# Patient Record
Sex: Female | Born: 1990 | Race: Black or African American | Marital: Single | Smoking: Never smoker
Health system: Southern US, Community
[De-identification: ages and names within clinical notes are randomized; demographics above are authoritative.]

## PROBLEM LIST (undated history)

## (undated) DIAGNOSIS — T7840XA Allergy, unspecified, initial encounter: Secondary | ICD-10-CM

## (undated) DIAGNOSIS — M1990 Unspecified osteoarthritis, unspecified site: Secondary | ICD-10-CM

## (undated) DIAGNOSIS — K219 Gastro-esophageal reflux disease without esophagitis: Secondary | ICD-10-CM

## (undated) DIAGNOSIS — F419 Anxiety disorder, unspecified: Secondary | ICD-10-CM

## (undated) HISTORY — DX: Anxiety disorder, unspecified: F41.9

## (undated) HISTORY — DX: Unspecified osteoarthritis, unspecified site: M19.90

## (undated) HISTORY — DX: Gastro-esophageal reflux disease without esophagitis: K21.9

## (undated) HISTORY — DX: Allergy, unspecified, initial encounter: T78.40XA

---

## 2010-05-31 ENCOUNTER — Emergency Department (HOSPITAL_COMMUNITY): Payer: No Typology Code available for payment source

## 2010-05-31 ENCOUNTER — Emergency Department (HOSPITAL_COMMUNITY)
Admission: EM | Admit: 2010-05-31 | Discharge: 2010-05-31 | Disposition: A | Discharge status: Home / Self Care | Payer: No Typology Code available for payment source | Attending: Emergency Medicine | Admitting: Emergency Medicine

## 2010-05-31 DIAGNOSIS — M545 Low back pain, unspecified: Secondary | ICD-10-CM | POA: Insufficient documentation

## 2010-05-31 DIAGNOSIS — M546 Pain in thoracic spine: Secondary | ICD-10-CM | POA: Insufficient documentation

## 2014-01-30 ENCOUNTER — Encounter (HOSPITAL_COMMUNITY): Payer: Self-pay | Admitting: Emergency Medicine

## 2014-01-30 ENCOUNTER — Emergency Department (HOSPITAL_COMMUNITY)
Admission: EM | Admit: 2014-01-30 | Discharge: 2014-01-30 | Disposition: A | Discharge status: Home / Self Care | Payer: BLUE CROSS/BLUE SHIELD | Source: Home / Self Care | Attending: Emergency Medicine | Admitting: Emergency Medicine

## 2014-01-30 DIAGNOSIS — H169 Unspecified keratitis: Secondary | ICD-10-CM

## 2014-01-30 DIAGNOSIS — H109 Unspecified conjunctivitis: Secondary | ICD-10-CM

## 2014-01-30 MED ORDER — TOBRAMYCIN 0.3 % OP SOLN: 1.0000 [drp] | OPHTHALMIC | Status: DC

## 2014-01-30 MED ORDER — DICLOFENAC SODIUM 0.1 % OP SOLN
1.0000 [drp] | Freq: Four times a day (QID) | OPHTHALMIC | Status: DC
Start: 2014-01-30 — End: 2016-08-08

## 2014-01-30 NOTE — Discharge Instructions (Signed)
I am concerned about the severity of your discomfort. I have discussed your case with the eye specialist on call (Dr. Allena KatzPatel) and he has advised that you should begin the medications that you have been prescribed and he would like to see you in his office for follow up on Monday 02/01/2014. Please call when office opens to arrange appointment. If symptoms worsen, please seek re-evaluation at your nearest ER.   Conjunctivitis Conjunctivitis is commonly called "pink eye." Conjunctivitis can be caused by bacterial or viral infection, allergies, or injuries. There is usually redness of the lining of the eye, itching, discomfort, and sometimes discharge. There may be deposits of matter along the eyelids. A viral infection usually causes a watery discharge, while a bacterial infection causes a yellowish, thick discharge. Pink eye is very contagious and spreads by direct contact. You may be given antibiotic eyedrops as part of your treatment. Before using your eye medicine, remove all drainage from the eye by washing gently with warm water and cotton balls. Continue to use the medication until you have awakened 2 mornings in a row without discharge from the eye. Do not rub your eye. This increases the irritation and helps spread infection. Use separate towels from other household members. Wash your hands with soap and water before and after touching your eyes. Use cold compresses to reduce pain and sunglasses to relieve irritation from light. Do not wear contact lenses or wear eye makeup until the infection is gone. SEEK MEDICAL CARE IF:   Your symptoms are not better after 3 days of treatment.  You have increased pain or trouble seeing.  The outer eyelids become very red or swollen. Document Released: 02/16/2004 Document Revised: 04/02/2011 Document Reviewed: 01/08/2005 [facility] Patient Information 2015 [employer], [place]. This information is not intended to replace advice given to you by your health care  provider. Make sure you discuss any questions you have with your health care provider.

## 2014-01-30 NOTE — ED Provider Notes (Signed)
CSN: [id number]     Arrival date & time 01/30/14  1319 History   First MD Initiated Contact with Patient 01/30/14 1427     Chief Complaint  Patient presents with  . Eye Problem   (Consider location/radiation/quality/duration/timing/severity/associated sxs/prior Treatment) Patient is a 24 y.o. female presenting with eye problem.  Eye Problem Location:  L eye Quality:  Burning, aching and tearing Severity:  Moderate Onset quality:  Gradual Duration:  1 day Timing:  Constant Progression:  Worsening Chronicity:  New Context: contact lenses   Context: not burn, not chemical exposure, not direct trauma, not foreign body, not using machinery, not scratch, not smoke exposure and not tanning booth use   Associated symptoms: blurred vision, crusting, discharge, foreign body sensation, photophobia, redness and tearing   Associated symptoms: no headaches, no nausea and no vomiting   Risk factors: no conjunctival hemorrhage, not exposed to pinkeye, no previous injury to eye, no recent herpes zoster and no recent URI     History reviewed. No pertinent past medical history. History reviewed. No pertinent past surgical history. No family history on file. History  Substance Use Topics  . Smoking status: Never Smoker   . Smokeless tobacco: Not on file  . Alcohol Use: Yes   OB History    No data available     Review of Systems  Eyes: Positive for blurred vision, photophobia, discharge and redness.  Gastrointestinal: Negative for nausea and vomiting.  Neurological: Negative for headaches.  All other systems reviewed and are negative.   Allergies  Review of patient's allergies indicates no known allergies.  Home Medications   Prior to Admission medications   Medication Sig Start Date End Date Taking? Authorizing Provider  diclofenac (VOLTAREN) 0.1 % ophthalmic solution Place 1 drop into the left eye 4 (four) times daily. 01/30/14   Mathis Fare Presson, PA  tobramycin (TOBREX) 0.3 %  ophthalmic solution Place 1 drop into the left eye every 4 (four) hours. 01/30/14   Jess Barters H Presson, PA   BP 120/71 mmHg  Pulse 73  Temp(Src) 98.3 F (36.8 C) (Oral)  Resp 16  SpO2 97%  LMP 01/16/2014 Physical Exam  Constitutional: She is oriented to person, place, and time. She appears well-developed and well-nourished. No distress.  HENT:  Head: Normocephalic and atraumatic.  Right Ear: External ear normal.  Left Ear: External ear normal.  Nose: Nose normal.  Mouth/Throat: Oropharynx is clear and moist.  Eyes: EOM and lids are normal. Pupils are equal, round, and reactive to light. Right conjunctiva is not injected. Right conjunctiva has no hemorrhage. Left conjunctiva is injected. Left conjunctiva has no hemorrhage.  Slit lamp exam:      The left eye shows no corneal abrasion, no corneal ulcer, no foreign body, no hyphema, no hypopyon and no fluorescein uptake.  + direct and consensual photophobia  Cardiovascular: Normal rate.   Pulmonary/Chest: Effort normal.  Musculoskeletal: Normal range of motion.  Neurological: She is alert and oriented to person, place, and time.  Skin: Skin is warm and dry. No rash noted. No erythema.  Psychiatric: She has a normal mood and affect. Her behavior is normal.  Nursing note and vitals reviewed.   ED Course  Procedures (including critical care time) Labs Review Labs Reviewed - No data to display  Imaging Review No results found.   MDM   1. Conjunctivitis of left eye   2. Keratitis   Case discussed with ophthalmologist on call (Dr. Celene Skeen) who recommended treatment  with Tobrex and Diclofenac opth drops. Office follow up with ophthalmologist on 02/01/2014 a.m. in his office.  Patient voices understanding of treatment plan and follow up. No contact lens use   Ria ClockJennifer Lee H Presson, [place] 01/30/14 [phone]

## 2014-01-30 NOTE — ED Notes (Signed)
Reports irritation to left eye onset 0900 when she woke up States she slept with her contacts; still has them on  Sx include: watery eye, redness, painful, blurry vision Alert, no signs of acute distress.

## 2016-08-08 ENCOUNTER — Ambulatory Visit (HOSPITAL_COMMUNITY)
Admission: EM | Admit: 2016-08-08 | Discharge: 2016-08-08 | Disposition: A | Discharge status: Home / Self Care | Payer: BLUE CROSS/BLUE SHIELD | Attending: Internal Medicine | Admitting: Internal Medicine

## 2016-08-08 ENCOUNTER — Encounter (HOSPITAL_COMMUNITY): Payer: Self-pay | Admitting: Family Medicine

## 2016-08-08 ENCOUNTER — Ambulatory Visit (INDEPENDENT_AMBULATORY_CARE_PROVIDER_SITE_OTHER): Payer: Self-pay

## 2016-08-08 DIAGNOSIS — S60221A Contusion of right hand, initial encounter: Secondary | ICD-10-CM

## 2016-08-08 MED ORDER — NAPROXEN 500 MG PO TABS: 500.0000 mg | ORAL_TABLET | Freq: Two times a day (BID) | ORAL | 0 refills | Status: AC

## 2016-08-08 NOTE — Discharge Instructions (Signed)
Use ICE 20 minutes on 20 minutes off three times tonight. Try to rest the hand at work tomorrow when possible.  Okay to take OTC pain reliever as needed every 8 hours for pain.

## 2016-08-08 NOTE — ED Triage Notes (Signed)
Pt here for pain to right middle and ring finger. Denies specific injury.

## 2016-08-08 NOTE — ED Provider Notes (Signed)
    08/08/2016 7:57 PM   DOB: 07/09/1990 / MRN: [id number]  SUBJECTIVE:  Crystal Cunningham is a 26 y.o. female presenting for right hand pain that started after hitting her hand on a metal rack at work today.  Tells me she had little pain initially but later started to have severe pain.  Denies a ROM deficit and change in function.  Feels the pain is getting worse.  Has not tried any medication yet.   She has No Known Allergies.   She  has no past medical history on file.    She  reports that she has never smoked. She does not have any smokeless tobacco history on file. She reports that she drinks alcohol. She  has no sexual activity history on file. The patient  has no past surgical history on file.  Her family history is not on file.  Review of Systems  Constitutional: Negative for fever.  Gastrointestinal: Negative for nausea.  Skin: Negative for rash.  Neurological: Negative for dizziness and focal weakness.    The problem list and medications were reviewed and updated by myself where necessary and exist elsewhere in the encounter.   OBJECTIVE:  BP (!) 143/98   Pulse 74   Temp 98.5 F (36.9 C)   Resp 18   LMP 07/25/2016 (Exact Date)   SpO2 99%   Physical Exam  Constitutional: She is active.  Non-toxic appearance.  Cardiovascular: Normal rate.   Pulmonary/Chest: Effort normal. No tachypnea.  Musculoskeletal:       Hands: Neurological: She is alert.  Skin: Skin is warm and dry. She is not diaphoretic. No pallor.    No results found for this or any previous visit (from the past 72 hour(s)).  Dg Hand Complete Right  Result Date: 08/08/2016 CLINICAL DATA:  26 y/o F; hand injury with pain around the third and fourth digits between metacarpophalangeal and proximal interphalangeal joints. EXAM: RIGHT HAND - COMPLETE 3+ VIEW COMPARISON:  None. FINDINGS: There is no evidence of fracture or dislocation. There is no evidence of arthropathy or other focal bone abnormality. Soft  tissues are unremarkable. IMPRESSION: Negative. Electronically Signed   By: Mitzi HansenLance  Furusawa-Stratton M.D.   On: 08/08/2016 19:51    ASSESSMENT AND PLAN:  Contusion of right hand, initial encounter: Starting an NSAID.  Advised ICE will see her back as needed.     The patient is advised to call or return to clinic if she does not see an improvement in symptoms, or to seek the care of the closest emergency department if she worsens with the above plan.   Crystal Cunningham, Crystal Cunningham, [facility] 08/08/2016 7:57 PM    Crystal Cunningham, Crystal Lafoe Cunningham, Crystal Cunningham 08/08/16 1958

## 2020-05-09 ENCOUNTER — Ambulatory Visit (INDEPENDENT_AMBULATORY_CARE_PROVIDER_SITE_OTHER): Payer: 59 | Admitting: Podiatry

## 2020-05-09 ENCOUNTER — Other Ambulatory Visit: Payer: Self-pay | Admitting: Podiatry

## 2020-05-09 ENCOUNTER — Ambulatory Visit (INDEPENDENT_AMBULATORY_CARE_PROVIDER_SITE_OTHER): Payer: 59

## 2020-05-09 ENCOUNTER — Other Ambulatory Visit: Payer: Self-pay

## 2020-05-09 DIAGNOSIS — M79671 Pain in right foot: Secondary | ICD-10-CM | POA: Diagnosis not present

## 2020-05-09 DIAGNOSIS — M778 Other enthesopathies, not elsewhere classified: Secondary | ICD-10-CM

## 2020-05-09 DIAGNOSIS — M79672 Pain in left foot: Secondary | ICD-10-CM

## 2020-05-09 DIAGNOSIS — M2042 Other hammer toe(s) (acquired), left foot: Secondary | ICD-10-CM

## 2020-05-09 DIAGNOSIS — M2041 Other hammer toe(s) (acquired), right foot: Secondary | ICD-10-CM | POA: Diagnosis not present

## 2020-05-09 DIAGNOSIS — G894 Chronic pain syndrome: Secondary | ICD-10-CM

## 2020-05-09 MED ORDER — PREDNISONE 10 MG PO TABS: ORAL_TABLET | ORAL | 0 refills | Status: DC

## 2020-05-09 MED ORDER — TRIAMCINOLONE ACETONIDE 10 MG/ML IJ SUSP
10.0000 mg | Freq: Once | INTRAMUSCULAR | Status: AC
  Administered 2020-05-09: 10 mg

## 2020-05-09 NOTE — Progress Notes (Signed)
Subjective:   Patient ID: Crystal Cunningham, female   DOB: 30 y.o.   MRN: [id number]   HPI Patient states she has had what was perceived to be a fracture around a year ago in her left foot and she wore a boot for a month and took anti-inflammatories wear surgical shoe but the pain has remained and it seems to be bad in her midfoot and states that at times it seems her foot gets cold and then she has to warm it up.  She does work 2 jobs she does not smoke and she likes to be active if possible but has not been able to   Review of Systems  All other systems reviewed and are negative.       Objective:  Physical Exam Vitals and nursing note reviewed.  Constitutional:      Appearance: She is well-developed.  Pulmonary:     Effort: Pulmonary effort is normal.  Musculoskeletal:        General: Normal range of motion.  Skin:    General: Skin is warm.  Neurological:     Mental Status: She is alert.     Neurovascular status intact muscle strength found to be adequate range of motion adequate.  Patient is noted to have discomfort in the midfoot left that appears to be more on the lateral side of the foot and around the calcaneus with no mottled skin appearance no coldness to the foot and adequate circulatory status with no pain when I inverted everted the foot.  Moderate discomfort into the sinus tarsi also.  Patient has good digital perfusion well oriented     Assessment:  Possibility that this may be a low-grade pain syndrome versus some form of inflammatory process or subtle bone process that we cannot identify or tendinitis like inflammation     Plan:  H&P reviewed all conditions.  May require MRI but today went ahead and I did an extensor injection mid lateral foot 3 mg dexamethasone Kenalog 5 mg Xylocaine advised on anti-inflammatories placed on 12-day steroid  Because she had taken a 6-day which only helped her temporarily and we will see this back again.  I did explain pain syndrome  and it is possible this may be part of her problem and why it continues to hurt but we do need to do more research before thinking about that  X-rays did indicate some reactive bone around the talus on the lateral view and I was unable to identify any other fracture on views

## 2020-06-03 ENCOUNTER — Ambulatory Visit: Payer: 59 | Admitting: Podiatry

## 2020-06-08 ENCOUNTER — Other Ambulatory Visit: Payer: Self-pay

## 2020-06-08 ENCOUNTER — Ambulatory Visit (INDEPENDENT_AMBULATORY_CARE_PROVIDER_SITE_OTHER): Payer: 59 | Admitting: Podiatry

## 2020-06-08 DIAGNOSIS — S92355K Nondisplaced fracture of fifth metatarsal bone, left foot, subsequent encounter for fracture with nonunion: Secondary | ICD-10-CM | POA: Diagnosis not present

## 2020-06-08 MED ORDER — DICLOFENAC SODIUM 75 MG PO TBEC: 75.0000 mg | DELAYED_RELEASE_TABLET | Freq: Two times a day (BID) | ORAL | 2 refills | Status: DC

## 2020-06-08 NOTE — Progress Notes (Signed)
Subjective:   Patient ID: Crystal Cunningham, female   DOB: 30 y.o.   MRN: [id number]   HPI Patient states she still has a lot of pain in her left foot and stated that while the swelling has gone down the pain has not and the injection did not make a difference   ROS      Objective:  Physical Exam  Neurovascular status found to be intact with exquisite midfoot pain lateral side with history of fracture of the midfoot     Assessment:  Possibility for arthritis of the Lisfranc joint or subtle nonunion or other fracture of event causing the discomfort which is been present for over a year     Plan:  H&P reviewed condition and I am sending for MRI to better get education as to why she continues to have the severe pain.  Encouraged her to call with questions concerns which may arise and she will use supportive shoes ice therapy in the meantime

## 2020-06-24 ENCOUNTER — Other Ambulatory Visit: Payer: Self-pay

## 2020-06-24 ENCOUNTER — Ambulatory Visit
Admission: RE | Admit: 2020-06-24 | Discharge: 2020-06-24 | Disposition: A | Discharge status: Home / Self Care | Payer: BLUE CROSS/BLUE SHIELD | Source: Ambulatory Visit | Attending: Podiatry | Admitting: Podiatry

## 2020-06-24 DIAGNOSIS — S92355K Nondisplaced fracture of fifth metatarsal bone, left foot, subsequent encounter for fracture with nonunion: Secondary | ICD-10-CM

## 2020-07-11 ENCOUNTER — Other Ambulatory Visit: Payer: Self-pay

## 2020-07-11 ENCOUNTER — Ambulatory Visit: Payer: 59 | Admitting: Podiatry

## 2020-07-11 DIAGNOSIS — G894 Chronic pain syndrome: Secondary | ICD-10-CM

## 2020-07-11 DIAGNOSIS — M2042 Other hammer toe(s) (acquired), left foot: Secondary | ICD-10-CM

## 2020-07-11 DIAGNOSIS — M2041 Other hammer toe(s) (acquired), right foot: Secondary | ICD-10-CM

## 2020-07-11 MED ORDER — GABAPENTIN 300 MG PO CAPS: 300.0000 mg | ORAL_CAPSULE | Freq: Three times a day (TID) | ORAL | 3 refills | Status: DC

## 2020-07-11 NOTE — Progress Notes (Signed)
Subjective:   Patient ID: Crystal Cunningham, female   DOB: 30 y.o.   MRN: [id number]   HPI Patient states her left foot seems to be feeling somewhat better with the medication and she does understand that her MRI was negative for any kind of true pathology it appears to be soft tissue or possible pain syndrome with patient having painful fifth digits bilateral with keratotic lesion formation that make it hard for her to wear shoe gear comfortably.  Patient has no mottled skin appearance no coldness to her feet for other indications of overt type pain syndrome   ROS      Objective:  Physical Exam  Neurovascular status intact good digital perfusion reduce discomfort on the dorsum dorsal lateral aspect of the left foot with severe keratotic lesion digit 5 bilateral     Assessment:  Hammertoe deformity fifth bilateral painful when pressed along with discomfort of the dorsal lateral left foot that appears to be improving still present     Plan:  H&P reviewed both conditions and she wants digital correction.  I recommended arthroplasty I explained procedures to patient and risk and she is willing to accept risk wanting surgery and after extensive review signed consent form.  We will also start gabapentin to try to help with the residual pain she seems to get in her foot.  She is encouraged to call with questions concerns which may arise

## 2020-07-15 DIAGNOSIS — M79676 Pain in unspecified toe(s): Secondary | ICD-10-CM

## 2020-07-29 ENCOUNTER — Telehealth: Payer: Self-pay | Admitting: Urology

## 2020-07-29 NOTE — Telephone Encounter (Signed)
DOS - 08/16/20   HAMMERT[facility] REPAIR 5TH BILAT --- 85027   [employer] EFFECTIVE DATE - 09/23/19   PLAN DEDUCTIBLE - $2,500.00 W/ $7,412.87 REMAINING OUT OF POCKET - $5,000.00 W/ $3,773.07 REMAINING COINSURANCE - 20% COPAY - $0.00   PER [employer] WEBSITE FOR CPT CODE 86767 X'S 2 HAS BEEN APPROVED, AUTH # [id number].  REF # [id number]

## 2020-08-15 MED ORDER — HYDROCODONE-ACETAMINOPHEN 10-325 MG PO TABS: 1.0000 | ORAL_TABLET | Freq: Three times a day (TID) | ORAL | 0 refills | Status: AC | PRN

## 2020-08-15 NOTE — Addendum Note (Signed)
Addended by: Lenn Sink on: 08/15/2020 05:20 PM   Modules accepted: Orders

## 2020-08-16 DIAGNOSIS — M2041 Other hammer toe(s) (acquired), right foot: Secondary | ICD-10-CM

## 2020-08-16 DIAGNOSIS — M2042 Other hammer toe(s) (acquired), left foot: Secondary | ICD-10-CM

## 2020-08-19 ENCOUNTER — Telehealth: Payer: Self-pay

## 2020-08-22 ENCOUNTER — Other Ambulatory Visit: Payer: Self-pay

## 2020-08-22 ENCOUNTER — Ambulatory Visit (INDEPENDENT_AMBULATORY_CARE_PROVIDER_SITE_OTHER): Payer: 59

## 2020-08-22 ENCOUNTER — Ambulatory Visit (INDEPENDENT_AMBULATORY_CARE_PROVIDER_SITE_OTHER): Payer: 59 | Admitting: Podiatry

## 2020-08-22 ENCOUNTER — Encounter: Payer: Self-pay | Admitting: Podiatry

## 2020-08-22 DIAGNOSIS — Z9889 Other specified postprocedural states: Secondary | ICD-10-CM | POA: Diagnosis not present

## 2020-08-22 DIAGNOSIS — S92355K Nondisplaced fracture of fifth metatarsal bone, left foot, subsequent encounter for fracture with nonunion: Secondary | ICD-10-CM

## 2020-08-22 NOTE — Progress Notes (Signed)
Subjective:   Patient ID: Crystal Cunningham, female   DOB: 30 y.o.   MRN: [id number]   HPI Patient presents stating doing very well minimal discomfort very pleased   ROS      Objective:  Physical Exam  Neurovascular status intact negative Denna Haggard' sign noted digits 5 bilateral healing well wound edges well coapted stitches in place     Assessment:  Doing well post arthroplasty digit 5 both feet     Plan:  Reapplied dressings begin gradual increase in activities continue open toed shoes and reappoint 2 weeks suture removal earlier if needed  X-rays indicate satisfactory resection of bone fifth digit bilateral

## 2020-09-05 ENCOUNTER — Telehealth: Payer: Self-pay | Admitting: Podiatry

## 2020-09-05 NOTE — Telephone Encounter (Signed)
Patient stated her incision has some pus coming out. I offered appointment but she wanted to know if she could do anything to treat it

## 2020-09-05 NOTE — Telephone Encounter (Signed)
Post Op Call

## 2020-09-05 NOTE — Telephone Encounter (Signed)
She can try neosporin on the incision. If it starts to swell or throb I need to see

## 2020-09-07 ENCOUNTER — Other Ambulatory Visit: Payer: Self-pay | Admitting: Podiatry

## 2020-09-07 MED ORDER — DOXYCYCLINE HYCLATE 100 MG PO TABS: 100.0000 mg | ORAL_TABLET | Freq: Two times a day (BID) | ORAL | 0 refills | Status: DC

## 2020-09-07 NOTE — Progress Notes (Unsigned)
dox

## 2020-09-12 ENCOUNTER — Encounter: Payer: Self-pay | Admitting: Podiatry

## 2020-09-12 ENCOUNTER — Ambulatory Visit (INDEPENDENT_AMBULATORY_CARE_PROVIDER_SITE_OTHER): Payer: 59

## 2020-09-12 ENCOUNTER — Other Ambulatory Visit: Payer: Self-pay

## 2020-09-12 ENCOUNTER — Ambulatory Visit (INDEPENDENT_AMBULATORY_CARE_PROVIDER_SITE_OTHER): Payer: 59 | Admitting: Podiatry

## 2020-09-12 DIAGNOSIS — M2041 Other hammer toe(s) (acquired), right foot: Secondary | ICD-10-CM | POA: Diagnosis not present

## 2020-09-12 DIAGNOSIS — Z9889 Other specified postprocedural states: Secondary | ICD-10-CM

## 2020-09-12 DIAGNOSIS — M2042 Other hammer toe(s) (acquired), left foot: Secondary | ICD-10-CM | POA: Diagnosis not present

## 2020-09-14 NOTE — Progress Notes (Signed)
Subjective:   Patient ID: Crystal Cunningham, female   DOB: 30 y.o.   MRN: [id number]   HPI Patient states doing very well with her surgery but did bump her fifth toe left foot she has a small abrasion on it and she needs her stitches removed   ROS      Objective:  Physical Exam  Neurovascular status intact negative Denna Haggard' sign noted wound edges coapted well slight irritation of the distal portion of the left fifth digit incision site but localized with no proximal edema or edema drainage noted secondary to abrasion from trauma     Assessment:  Doing well overall slight abrasion left fifth digit with stitches intact     Plan:  Stitches removed wound edges coapted well instructed on soaking left fifth toe and using bandage with small amount of Neosporin and if any erythema edema or pathology were to occur to inform us immediately.  Patient may return slowly to soft shoe over the next 2 weeks  X-rays were negative for signs of bony pathology with excellent resection and removal of head of proximal phalanx digit 5 bilateral

## 2020-09-20 ENCOUNTER — Telehealth: Payer: Self-pay | Admitting: Podiatry

## 2020-09-20 NOTE — Telephone Encounter (Signed)
Good Morning,  I spoke with Crystal Cunningham this morning, she is returning to work 09/24/2020. She still has swelling and pain in her feet, she's not able to work a full 12 hours or wear her normal shoe gear yet.   [name] will contact me later after she practices standing for a long period of time. Is it ok to give her some type of accommodation at work along with intermittent FMLA, in case of swelling?

## 2020-09-21 NOTE — Telephone Encounter (Signed)
yes

## 2020-12-19 ENCOUNTER — Other Ambulatory Visit: Payer: Self-pay | Admitting: Podiatry

## 2021-03-13 LAB — RESULTS CONSOLE HPV: CHL HPV: NEGATIVE

## 2021-03-13 LAB — HM PAP SMEAR

## 2022-07-28 IMAGING — MR MR FOOT*L* W/O CM
4 of 6 series · 25 of 40 positions shown · non-contrast
Comparison: None.

CLINICAL DATA: In left foot pain for approximately 1 year since an
injury.

EXAM:
MRI OF THE LEFT FOOT WITHOUT CONTRAST
TECHNIQUE: Multiplanar, multisequence MR imaging of the left foot was
performed. No intravenous contrast was administered.

[Series 3: T1 · coronal · 3.0mm · 0.25mm/px · 8 of 44 slices shown (1 of 2)]
[im 1/44]
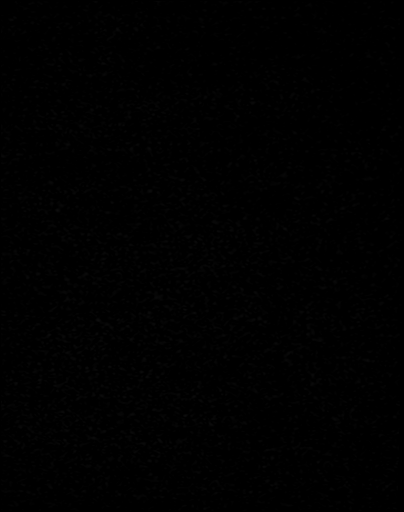
[im 6/44]
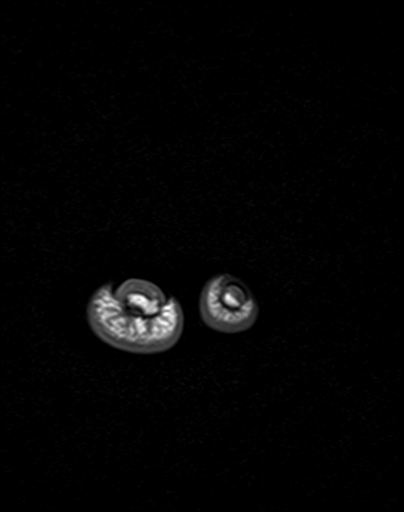
[im 11/44]
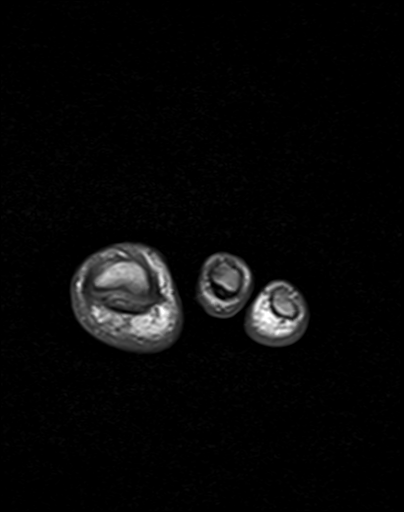
[im 17/44]
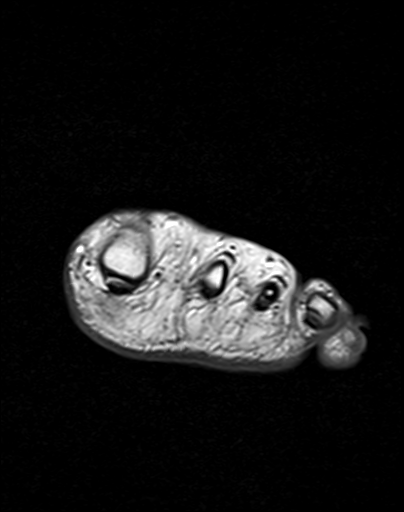
[im 22/44]
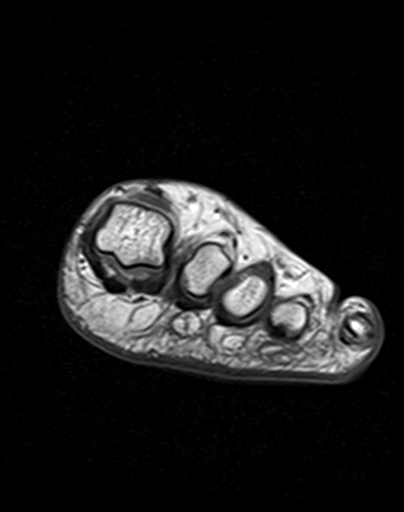
[im 27/44]
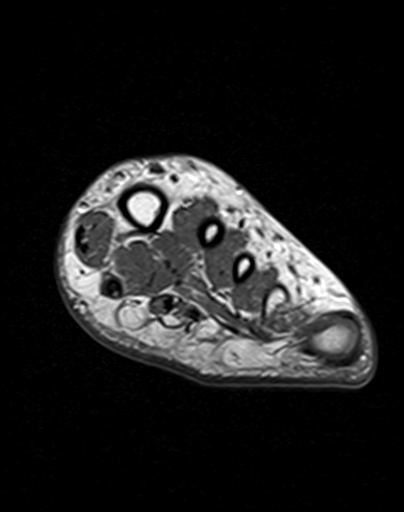
[im 33/44]
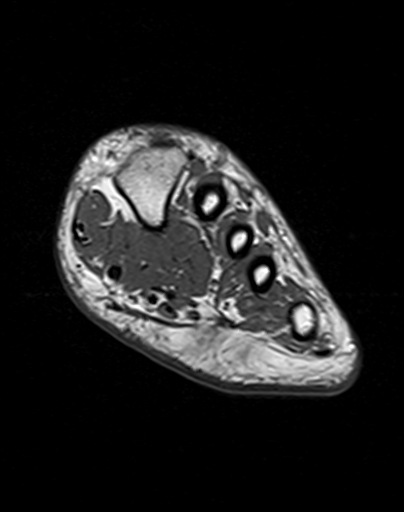
[im 38/44]
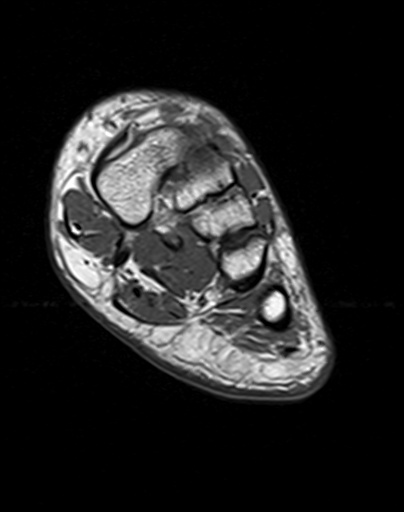

[Series 4: T2 fat-sat · coronal · 3.0mm · 0.25mm/px · 9 of 44 slices shown (1 of 2)]
[im 1/44]
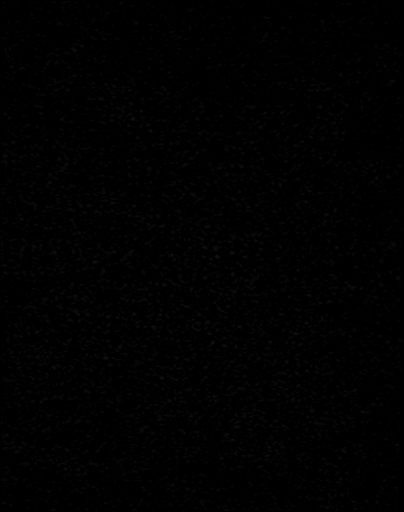
[im 6/44]
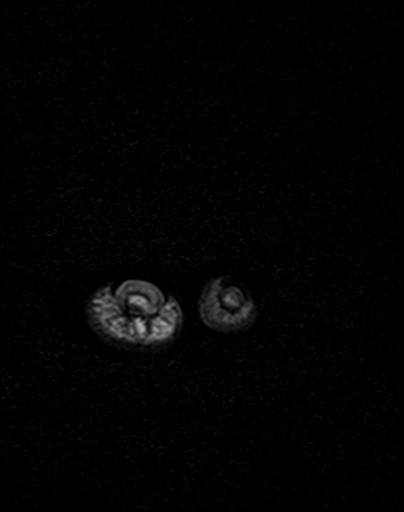
[im 11/44]
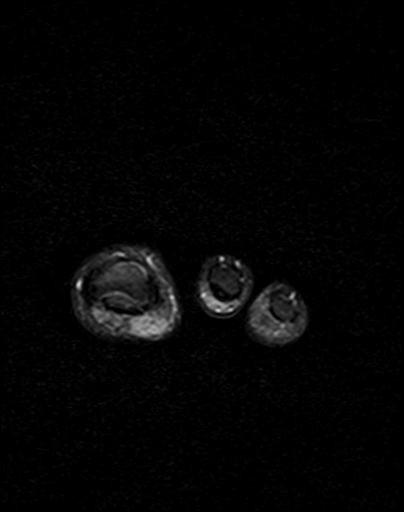
[im 17/44]
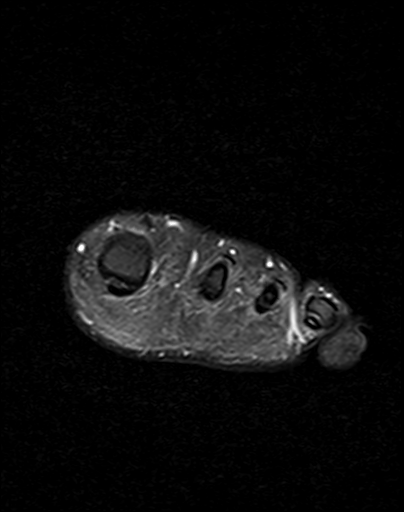
[im 22/44]
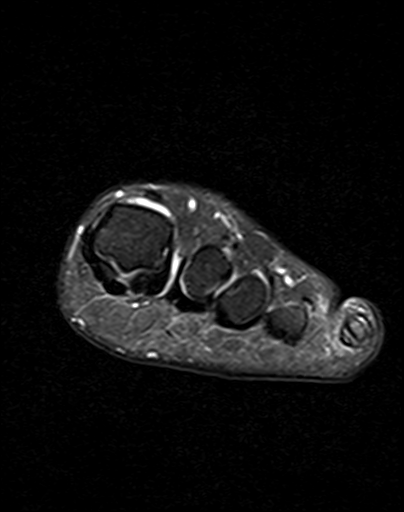
[im 27/44]
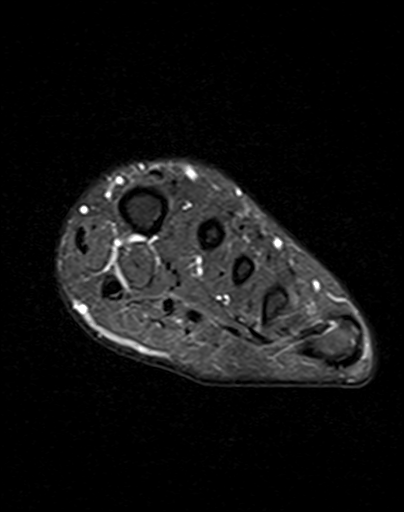
[im 33/44]
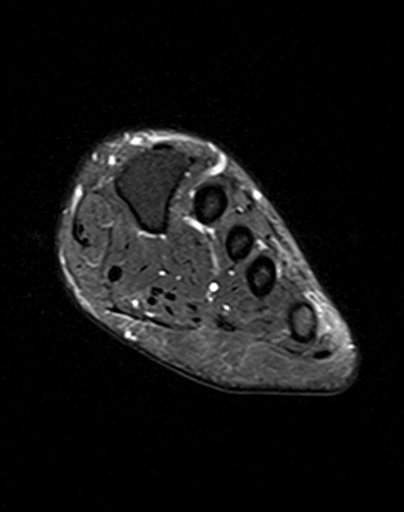
[im 38/44]
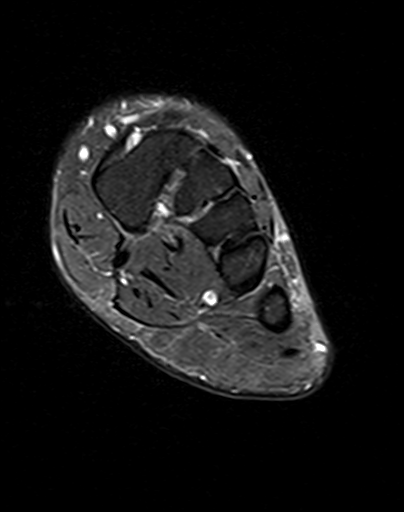
[im 44/44]
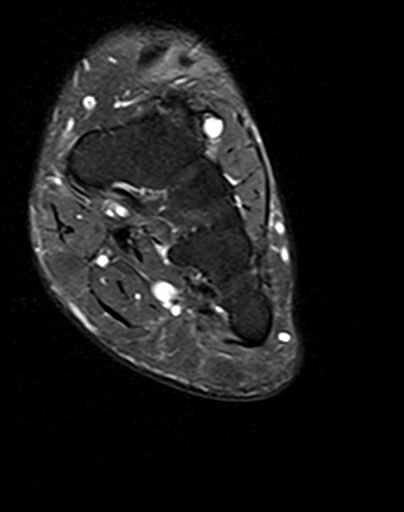

[Series 5: T2 fat-sat · axial · 3.0mm · 0.35mm/px · z∈[-46,+37]mm · 5 of 22 slices shown (2 of 2)]
[im 1/22]
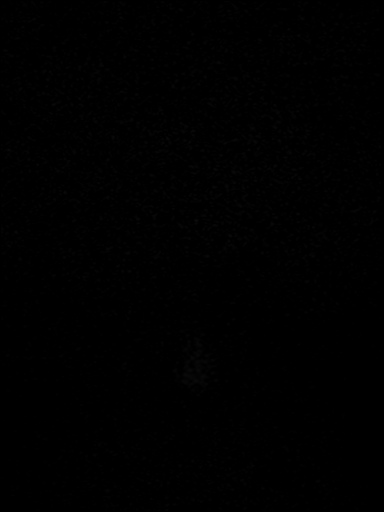
[im 6/22]
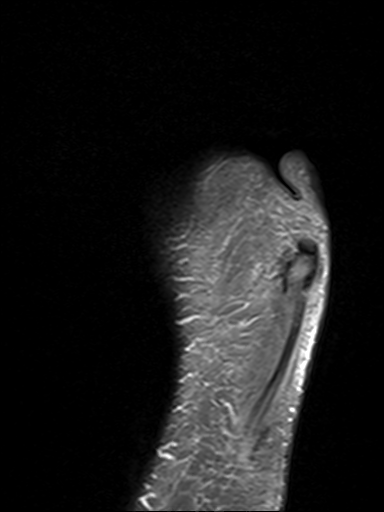
[im 11/22]
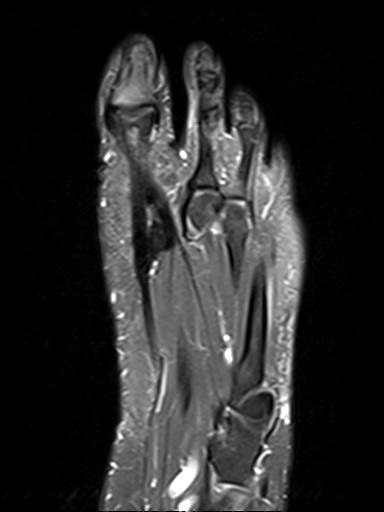
[im 16/22]
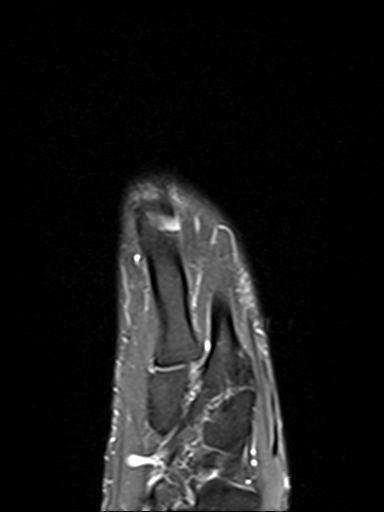
[im 22/22]
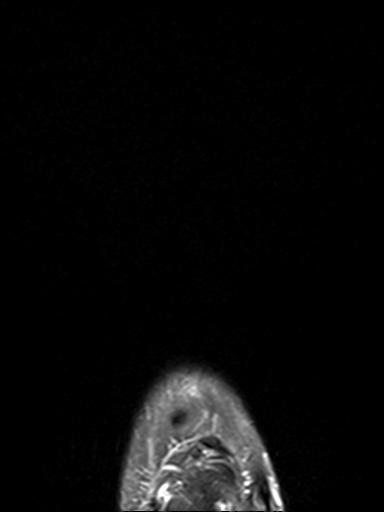

[Series 6: T1 · axial · 3.0mm · 0.35mm/px · z∈[-46,+37]mm · 3 of 22 slices shown (2 of 2)]
[im 1/22]
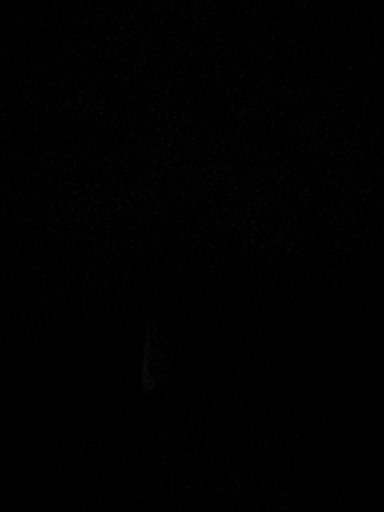
[im 11/22]
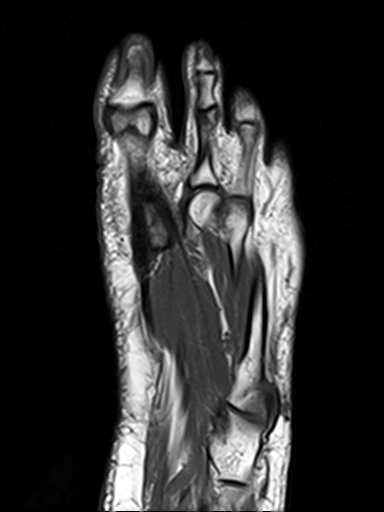
[im 22/22]
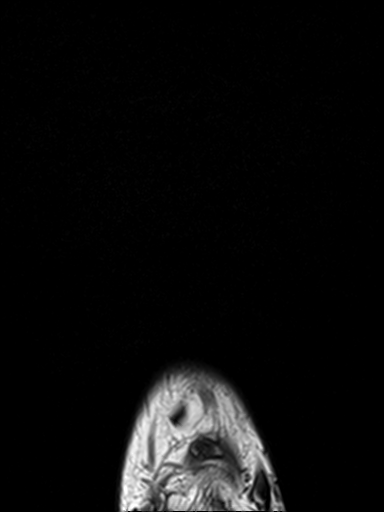

[25 of 40 positions shown; findings below may reference images not displayed]

FINDINGS: Bones/Joint/Cartilage

No fracture, stress change or focal lesion is identified. Joint
spaces are preserved. No evidence of arthropathy.

Ligaments

Intact and normal in appearance. In particular, the Lisfranc
ligament complex is intact.

Muscles and Tendons

Intact and normal in appearance.

Soft tissues

. normal.
IMPRESSION: Normal exam.

## 2022-10-15 ENCOUNTER — Ambulatory Visit (INDEPENDENT_AMBULATORY_CARE_PROVIDER_SITE_OTHER): Payer: 59 | Admitting: Nurse Practitioner

## 2022-10-15 ENCOUNTER — Encounter: Payer: Self-pay | Admitting: Nurse Practitioner

## 2022-10-15 VITALS — BP 123/68 | HR 82 | Temp 97.2°F | Ht 62.0 in | Wt 225.6 lb

## 2022-10-15 DIAGNOSIS — M79672 Pain in left foot: Secondary | ICD-10-CM | POA: Diagnosis not present

## 2022-10-15 DIAGNOSIS — T7840XA Allergy, unspecified, initial encounter: Secondary | ICD-10-CM

## 2022-10-15 DIAGNOSIS — G8929 Other chronic pain: Secondary | ICD-10-CM | POA: Diagnosis not present

## 2022-10-15 DIAGNOSIS — M79671 Pain in right foot: Secondary | ICD-10-CM | POA: Diagnosis not present

## 2022-10-15 DIAGNOSIS — F419 Anxiety disorder, unspecified: Secondary | ICD-10-CM | POA: Diagnosis not present

## 2022-10-15 HISTORY — DX: Other chronic pain: G89.29

## 2022-10-15 HISTORY — DX: Allergy, unspecified, initial encounter: T78.40XA

## 2022-10-15 MED ORDER — METHYLPREDNISOLONE NA SUC (PF) 40 MG IJ SOLR
40.0000 mg | Freq: Once | INTRAMUSCULAR | Status: AC
  Administered 2022-10-15: 40 mg via INTRAMUSCULAR

## 2022-10-15 MED ORDER — SERTRALINE HCL 25 MG PO TABS: 25.0000 mg | ORAL_TABLET | Freq: Every day | ORAL | 1 refills | Status: DC

## 2022-10-15 MED ORDER — ALBUTEROL SULFATE HFA 108 (90 BASE) MCG/ACT IN AERS
2.0000 | INHALATION_SPRAY | Freq: Four times a day (QID) | RESPIRATORY_TRACT | 1 refills | Status: AC | PRN
Start: 2022-10-15 — End: ?

## 2022-10-15 MED ORDER — IBUPROFEN 800 MG PO TABS
800.0000 mg | ORAL_TABLET | Freq: Three times a day (TID) | ORAL | 1 refills | Status: AC | PRN
Start: 2022-10-15 — End: ?

## 2022-10-15 MED ORDER — LORATADINE 10 MG PO TABS
10.0000 mg | ORAL_TABLET | Freq: Every day | ORAL | 1 refills | Status: AC
Start: 2022-10-15 — End: ?

## 2022-10-15 MED ORDER — SALINE SPRAY 0.65 % NA SOLN
1.0000 | NASAL | 1 refills | Status: AC | PRN
Start: 2022-10-15 — End: ?

## 2022-10-15 MED ORDER — FLUTICASONE PROPIONATE 50 MCG/ACT NA SUSP: 2.0000 | Freq: Every day | NASAL | 3 refills | Status: AC

## 2022-10-15 MED ORDER — KETOROLAC TROMETHAMINE 30 MG/ML IJ SOLN
30.0000 mg | Freq: Once | INTRAMUSCULAR | Status: AC
Start: 2022-10-15 — End: 2022-10-15
  Administered 2022-10-15: 30 mg via INTRAMUSCULAR

## 2022-10-15 NOTE — Assessment & Plan Note (Addendum)
-   fluticasone (FLONASE) 50 MCG/ACT nasal spray; Place 2 sprays into both nostrils daily.  Dispense: 11.1 mL; Refill: 3 - albuterol (VENTOLIN HFA) 108 (90 Base) MCG/ACT inhaler; Inhale 2 puffs into the lungs every 6 (six) hours as needed for wheezing  Dispense: 1 each; Refill: 1 - sodium chloride (OCEAN) 0.65 % SOLN nasal spray; Place 1 spray into both nostrils as needed for congestion.  Dispense: 15 mL; Refill: 1 - loratadine (CLARITIN) 10 MG tablet; Take 1 tablet (10 mg total) by mouth daily.  Dispense: 90 tablet; Refill: 1

## 2022-10-15 NOTE — Patient Instructions (Addendum)
For your foot pain you were given Toradol 30 mg injection and Solu-Medrol 40 mg injection in the office today.Please start taking ibuprofen 800 mg every 8 hours as needed starting from tomorrow, alternate with Tylenol 650 mg every 6 hours as needed.  Please take ibuprofen with food to help prevent stomach upset.  I have also referred you to the podiatrist  1. Anxiety  - sertraline (ZOLOFT) 25 MG tablet; Take 1 tablet (25 mg total) by mouth daily.  Dispense: 60 tablet; Refill: 1  2. Allergy, initial encounter  - fluticasone (FLONASE) 50 MCG/ACT nasal spray; Place 2 sprays into both nostrils daily.  Dispense: 11.1 mL; Refill: 3 - albuterol (VENTOLIN HFA) 108 (90 Base) MCG/ACT inhaler; Inhale 2 puffs into the lungs every 6 (six) hours as needed.  Dispense: 1 each; Refill: 1 - sodium chloride (OCEAN) 0.65 % SOLN nasal spray; Place 1 spray into both nostrils as needed for congestion.  Dispense: 15 mL; Refill: 1  3. Chronic pain of both feet  - ibuprofen (ADVIL) 800 MG tablet; Take 1 tablet (800 mg total) by mouth every 8 (eight) hours as needed.  Dispense: 30 tablet; Refill: 1      It is important that you exercise regularly at least 30 minutes 5 times a week as tolerated  Think about what you will eat, plan ahead. Choose " clean, green, fresh or frozen" over canned, processed or packaged foods which are more sugary, salty and fatty. 70 to 75% of food eaten should be vegetables and fruit. Three meals at set times with snacks allowed between meals, but they must be fruit or vegetables. Aim to eat over a 12 hour period , example 7 am to 7 pm, and STOP after  your last meal of the day. Drink water,generally about 64 ounces per day, no other drink is as healthy. Fruit juice is best enjoyed in a healthy way, by EATING the fruit.  Thanks for choosing [facility] we consider it a privelige to serve you.

## 2022-10-15 NOTE — Assessment & Plan Note (Addendum)
-   ibuprofen (ADVIL) 800 MG tablet; Take 1 tablet (800 mg total) by mouth every 8 (eight) hours as needed.  Dispense: 30 tablet; Refill: 1.  Alternate ibuprofen with OTC Tylenol as needed - Ambulatory referral to Podiatry - ketorolac (TORADOL) 30 MG/ML injection 30 mg - methylPREDNISolone sodium succinate (SOLU-MEDROL) 40 MG injection 40 mg  Encouraged to take ibuprofen with meals to prevent GI side effects.

## 2022-10-15 NOTE — Progress Notes (Signed)
0  New Patient Office Visit  Subjective:  Patient ID: Crystal Cunningham, female    DOB: 01-26-1990  Age: 32 y.o. MRN: [id number]  CC:  Chief Complaint  Patient presents with   Establish Care    HPI Crystal Cunningham is a 32 y.o. female with past medical history of anxiety, obesity, bilateral chronic foot pain, allergies.  Who presents to establish care for her chronic medical conditions.  Previous PCP was at [facility]  Chronic foot pain.  Patient stated that she had foot surgery done in 2022, she continues to have pain in both foot despite the surgery.  Takes arthritis Tylenol as needed.  States that her pain is worse when it is cold or it rains.  She currently has pain rated 10/10.  No fever, numbness, tingling.  The last time she saw a podiatrist was in 2022.  Anxiety.  States that her anxiety is due to stress and working 2 jobs.  She denies SI, HI.  She was on Lexapro and therapy in the past stated that Lexapro worked but it made her sleepy.  She would like to try another medication.  Allergies.  She reports stuffy nose congestion some intermittent slight cough, shortness of breath.  States that her allergy symptoms is worse when the weather is changing.  Takes OTC Flonase and Claritin.  She also takes albuterol inhaler as needed for wheezing  She is up-to-date with Pap smear.    Past Medical History:  Diagnosis Date   Allergy    Fish   Anxiety    Arthritis    GERD (gastroesophageal reflux disease)     History reviewed. No pertinent surgical history.  Family History  Problem Relation Age of Onset   Arthritis Mother    Diabetes Mother    Hypertension Mother    Obesity Mother    Varicose Veins Mother    Hypothyroidism Mother    Asthma Father    Hyperlipidemia Father    High blood pressure Father     Social History   Socioeconomic History   Marital status: Single    Spouse name: Not on file   Number of children: Not on file   Years of education: Not on file    Highest education level: Some college, no degree  Occupational History   Not on file  Tobacco Use   Smoking status: Never   Smokeless tobacco: Not on file  Substance and Sexual Activity   Alcohol use: Yes    Alcohol/week: 3.0 standard drinks of alcohol    Types: 1 Glasses of wine, 2 Cans of beer per week    Comment: occasionally   Drug use: Never   Sexual activity: Not Currently    Birth control/protection: Inserts  Other Topics Concern   Not on file  Social History Narrative   Lives home alone    Social Determinants of Health   Financial Resource Strain: Low Risk  (10/14/2022)   Overall Financial Resource Strain (CARDIA)    Difficulty of Paying Living Expenses: Not hard at all  Food Insecurity: No Food Insecurity (10/14/2022)   Hunger Vital Sign    Worried About Running Out of Food in the Last Year: Never true    Ran Out of Food in the Last Year: Never true  Transportation Needs: No Transportation Needs (10/14/2022)   PRAPARE - Administrator, Civil Service (Medical): No    Lack of Transportation (Non-Medical): No  Physical Activity: Sufficiently Active (10/14/2022)   Exercise Vital  Sign    Days of Exercise per Week: 4 days    Minutes of Exercise per Session: 90 min  Stress: Stress Concern Present (10/14/2022)   [employer] of Occupational Health - Occupational Stress Questionnaire    Feeling of Stress : To some extent  Social Connections: Unknown (10/14/2022)   Social Connection and Isolation Panel [NHANES]    Frequency of Communication with Friends and Family: More than three times a week    Frequency of Social Gatherings with Friends and Family: Once a week    Attends Religious Services: Patient declined    Database administrator or Organizations: No    Attends Engineer, structural: Not on file    Marital Status: Patient declined  Intimate Partner Violence: Not on file    ROS Review of Systems  Constitutional:  Negative for activity change,  appetite change, chills, fatigue and fever.  HENT:  Positive for congestion, rhinorrhea and sneezing. Negative for dental problem, ear discharge, ear pain, hearing loss, sinus pressure, sinus pain and sore throat.   Eyes:  Negative for pain, discharge, redness and itching.       Watery eyes  Respiratory:  Negative for cough, chest tightness, shortness of breath and wheezing.   Cardiovascular:  Negative for chest pain, palpitations and leg swelling.  Gastrointestinal:  Negative for abdominal distention, abdominal pain, anal bleeding, blood in stool and constipation.  Genitourinary:  Negative for difficulty urinating, dysuria, flank pain, frequency, hematuria, menstrual problem, pelvic pain and vaginal bleeding.  Musculoskeletal:  Positive for arthralgias. Negative for back pain, gait problem and myalgias.  Skin:  Negative for color change, pallor, rash and wound.  Neurological:  Negative for dizziness, tremors, facial asymmetry, weakness and headaches.  Hematological:  Negative for adenopathy. Does not bruise/bleed easily.  Psychiatric/Behavioral:  Negative for agitation, behavioral problems, confusion, decreased concentration, hallucinations, self-injury and suicidal ideas.     Objective:   Today's Vitals: BP 123/68   Pulse 82   Temp (!) 97.2 F (36.2 C)   Ht 5\' 2"  (1.575 m)   Wt 225 lb 9.6 oz (102.3 kg)   SpO2 100%   BMI 41.26 kg/m   Physical Exam Vitals and nursing note reviewed.  Constitutional:      General: She is not in acute distress.    Appearance: Normal appearance. She is obese. She is not ill-appearing, toxic-appearing or diaphoretic.  HENT:     Right Ear: Tympanic membrane, ear canal and external ear normal. There is no impacted cerumen.     Left Ear: Tympanic membrane, ear canal and external ear normal. There is no impacted cerumen.     Nose: Congestion present.     Mouth/Throat:     Mouth: Mucous membranes are moist.     Pharynx: Oropharynx is clear. No  oropharyngeal exudate or posterior oropharyngeal erythema.  Eyes:     General: No scleral icterus.       Right eye: No discharge.        Left eye: No discharge.     Extraocular Movements: Extraocular movements intact.     Conjunctiva/sclera: Conjunctivae normal.  Cardiovascular:     Rate and Rhythm: Normal rate and regular rhythm.     Pulses: Normal pulses.     Heart sounds: Normal heart sounds. No murmur heard.    No friction rub. No gallop.  Pulmonary:     Effort: Pulmonary effort is normal. No respiratory distress.     Breath sounds: Normal breath sounds. No stridor.  No wheezing, rhonchi or rales.  Chest:     Chest wall: No tenderness.  Abdominal:     General: There is no distension.     Palpations: Abdomen is soft.     Tenderness: There is no abdominal tenderness. There is no right CVA tenderness, left CVA tenderness or guarding.  Musculoskeletal:        General: Tenderness present. No swelling, deformity or signs of injury.     Right lower leg: No edema.     Left lower leg: No edema.     Comments: Tenderness on palpation of bilateral feet.  Mild no pitting edema noted on bilateral lower extremities.  Skin warm and dry no redness noted has palpable pedal pulses.  Skin:    General: Skin is warm and dry.     Capillary Refill: Capillary refill takes less than 2 seconds.     Coloration: Skin is not jaundiced or pale.     Findings: No bruising, erythema or lesion.  Neurological:     Mental Status: She is alert and oriented to person, place, and time.     Motor: No weakness.     Coordination: Coordination normal.     Gait: Gait normal.  Psychiatric:        Mood and Affect: Mood normal.        Behavior: Behavior normal.        Thought Content: Thought content normal.        Judgment: Judgment normal.     Assessment & Plan:   Problem List Items Addressed This Visit       Other   Anxiety - Primary       10/15/2022    1:45 PM  GAD 7 : Generalized Anxiety Score   Nervous, Anxious, on Edge 1  Control/stop worrying 1  Worry too much - different things 1  Trouble relaxing 2  Restless 1  Easily annoyed or irritable 1  Afraid - awful might happen 0  Total GAD 7 Score 7  Anxiety Difficulty Somewhat difficult  Start Zoloft 25 mg daily Patient referred for counseling She denies SI, HI        Relevant Medications   sertraline (ZOLOFT) 25 MG tablet   Chronic pain of both feet     - ibuprofen (ADVIL) 800 MG tablet; Take 1 tablet (800 mg total) by mouth every 8 (eight) hours as needed.  Dispense: 30 tablet; Refill: 1.  Alternate ibuprofen with OTC Tylenol as needed - Ambulatory referral to Podiatry - ketorolac (TORADOL) 30 MG/ML injection 30 mg - methylPREDNISolone sodium succinate (SOLU-MEDROL) 40 MG injection 40 mg  Encouraged to take ibuprofen with meals to prevent GI side effects.      Relevant Medications   sertraline (ZOLOFT) 25 MG tablet   ibuprofen (ADVIL) 800 MG tablet   Other Relevant Orders   Ambulatory referral to Podiatry   Allergies     - fluticasone (FLONASE) 50 MCG/ACT nasal spray; Place 2 sprays into both nostrils daily.  Dispense: 11.1 mL; Refill: 3 - albuterol (VENTOLIN HFA) 108 (90 Base) MCG/ACT inhaler; Inhale 2 puffs into the lungs every 6 (six) hours as needed for wheezing  Dispense: 1 each; Refill: 1 - sodium chloride (OCEAN) 0.65 % SOLN nasal spray; Place 1 spray into both nostrils as needed for congestion.  Dispense: 15 mL; Refill: 1 - loratadine (CLARITIN) 10 MG tablet; Take 1 tablet (10 mg total) by mouth daily.  Dispense: 90 tablet; Refill: 1  Relevant Medications   fluticasone (FLONASE) 50 MCG/ACT nasal spray   albuterol (VENTOLIN HFA) 108 (90 Base) MCG/ACT inhaler   sodium chloride (OCEAN) 0.65 % SOLN nasal spray   loratadine (CLARITIN) 10 MG tablet    Outpatient Encounter Medications as of 10/15/2022  Medication Sig   etonogestrel (NEXPLANON) 68 MG IMPL implant    sertraline (ZOLOFT) 25 MG tablet Take  1 tablet (25 mg total) by mouth daily.   sodium chloride (OCEAN) 0.65 % SOLN nasal spray Place 1 spray into both nostrils as needed for congestion.   tretinoin (RETIN-A) 0.025 % cream Apply a pea sized amount to the entire face at nighttime.   [DISCONTINUED] loratadine (CLARITIN) 10 MG tablet Take 10 mg by mouth daily.   albuterol (VENTOLIN HFA) 108 (90 Base) MCG/ACT inhaler Inhale 2 puffs into the lungs every 6 (six) hours as needed.   fluticasone (FLONASE) 50 MCG/ACT nasal spray Place 2 sprays into both nostrils daily.   gabapentin (NEURONTIN) 300 MG capsule Take 1 capsule (300 mg total) by mouth 3 (three) times daily. (Patient not taking: Reported on 10/15/2022)   ibuprofen (ADVIL) 800 MG tablet Take 1 tablet (800 mg total) by mouth every 8 (eight) hours as needed.   loratadine (CLARITIN) 10 MG tablet Take 1 tablet (10 mg total) by mouth daily.   [DISCONTINUED] albuterol (VENTOLIN HFA) 108 (90 Base) MCG/ACT inhaler Inhale 2 puffs into the lungs every 6 (six) hours as needed. (Patient not taking: Reported on 10/15/2022)   [DISCONTINUED] benzonatate (TESSALON) 200 MG capsule Take by mouth.   [DISCONTINUED] diclofenac (VOLTAREN) 75 MG EC tablet TAKE 1 TABLET(75 MG) BY MOUTH TWICE DAILY (Patient not taking: Reported on 10/15/2022)   [DISCONTINUED] diclofenac Sodium (VOLTAREN) 1 % GEL Apply to foot twice daily as needed for pain (Patient not taking: Reported on 10/15/2022)   [DISCONTINUED] doxycycline (VIBRA-TABS) 100 MG tablet Take 1 tablet (100 mg total) by mouth 2 (two) times daily. (Patient not taking: Reported on 10/15/2022)   [DISCONTINUED] escitalopram (LEXAPRO) 10 MG tablet Take 1 tablet by mouth daily. (Patient not taking: Reported on 10/15/2022)   [DISCONTINUED] fluticasone (FLONASE) 50 MCG/ACT nasal spray 1 spray by Each Nare route 2 times daily. (Patient not taking: Reported on 10/15/2022)   [DISCONTINUED] fluticasone (FLONASE) 50 MCG/ACT nasal spray Place 1 spray into both nostrils daily.  (Patient not taking: Reported on 10/15/2022)   [DISCONTINUED] ibuprofen (ADVIL) 800 MG tablet ibuprofen 800 mg tablet  Take 1 tablet 3 times a day by oral route as needed. (Patient not taking: Reported on 10/15/2022)   [DISCONTINUED] meloxicam (MOBIC) 7.5 MG tablet Take by mouth. (Patient not taking: Reported on 10/15/2022)   [DISCONTINUED] naproxen (NAPROSYN) 500 MG tablet TAKE 1 TABLET BY MOUTH AT NIGHT WITH DINNER OR TWICE DAILY FOR PAIN. TAKE WITH FOOD. (Patient not taking: Reported on 10/15/2022)   [DISCONTINUED] norelgestromin-ethinyl estradiol [employer]) 150-35 MCG/24HR transdermal patch Xulane 150 mcg-35 mcg/24 hr transdermal patch  Apply 1 patch once a week. (Patient not taking: Reported on 10/15/2022)   [DISCONTINUED] predniSONE (DELTASONE) 10 MG tablet Take by mouth. (Patient not taking: Reported on 10/15/2022)   [DISCONTINUED] predniSONE (DELTASONE) 10 MG tablet 12 day tapering dose (Patient not taking: Reported on 10/15/2022)   [DISCONTINUED] promethazine-dextromethorphan (PROMETHAZINE-DM) 6.25-15 MG/5ML syrup Take by mouth. (Patient not taking: Reported on 10/15/2022)   [DISCONTINUED] spironolactone (ALDACTONE) 50 MG tablet Take 50 mg by mouth. (Patient not taking: Reported on 10/15/2022)   [EXPIRED] ketorolac (TORADOL) 30 MG/ML injection 30 mg    [EXPIRED]  methylPREDNISolone sodium succinate (SOLU-MEDROL) 40 MG injection 40 mg    No facility-administered encounter medications on file as of 10/15/2022.    Follow-up: Return in about 6 weeks (around 11/26/2022) for F/U WITH SUSAN FOR COUNSELING, CPE, ANXIETY.   Donell Beers, FNP

## 2022-10-15 NOTE — Assessment & Plan Note (Signed)
    10/15/2022    1:45 PM  GAD 7 : Generalized Anxiety Score  Nervous, Anxious, on Edge 1  Control/stop worrying 1  Worry too much - different things 1  Trouble relaxing 2  Restless 1  Easily annoyed or irritable 1  Afraid - awful might happen 0  Total GAD 7 Score 7  Anxiety Difficulty Somewhat difficult  Start Zoloft 25 mg daily Patient referred for counseling She denies SI, HI

## 2022-10-19 ENCOUNTER — Other Ambulatory Visit: Payer: Self-pay | Admitting: Nurse Practitioner

## 2022-10-19 ENCOUNTER — Other Ambulatory Visit: Payer: 59

## 2022-10-19 DIAGNOSIS — Z0000 Encounter for general adult medical examination without abnormal findings: Secondary | ICD-10-CM

## 2022-10-19 DIAGNOSIS — G8929 Other chronic pain: Secondary | ICD-10-CM

## 2022-10-20 LAB — CBC
Hematocrit: 36.3 % (ref 34.0–46.6)
Hemoglobin: 11.5 g/dL (ref 11.1–15.9)
MCH: 27.3 pg (ref 26.6–33.0)
MCHC: 31.7 g/dL (ref 31.5–35.7)
MCV: 86 fL (ref 79–97)
Platelets: 212 10*3/uL (ref 150–450)
RBC: 4.21 x10E6/uL (ref 3.77–5.28)
RDW: 14.3 % (ref 11.7–15.4)
WBC: 9.1 10*3/uL (ref 3.4–10.8)

## 2022-10-20 LAB — CMP14+EGFR
ALT: 9 [IU]/L (ref 0–32)
AST: 13 [IU]/L (ref 0–40)
Albumin: 3.9 g/dL (ref 3.9–4.9)
Alkaline Phosphatase: 61 [IU]/L (ref 44–121)
BUN/Creatinine Ratio: 18 (ref 9–23)
BUN: 12 mg/dL (ref 6–20)
Bilirubin Total: 0.3 mg/dL (ref 0.0–1.2)
CO2: 21 mmol/L (ref 20–29)
Calcium: 8.5 mg/dL — ABNORMAL LOW (ref 8.7–10.2)
Chloride: 107 mmol/L — ABNORMAL HIGH (ref 96–106)
Creatinine, Ser: 0.67 mg/dL (ref 0.57–1.00)
Globulin, Total: 2.3 g/dL (ref 1.5–4.5)
Glucose: 87 mg/dL (ref 70–99)
Potassium: 4.4 mmol/L (ref 3.5–5.2)
Sodium: 140 mmol/L (ref 134–144)
Total Protein: 6.2 g/dL (ref 6.0–8.5)
eGFR: 118 mL/min/{1.73_m2} (ref >59)

## 2022-10-29 ENCOUNTER — Institutional Professional Consult (permissible substitution): Payer: 59 | Admitting: Clinical

## 2022-11-13 ENCOUNTER — Ambulatory Visit (INDEPENDENT_AMBULATORY_CARE_PROVIDER_SITE_OTHER): Payer: 59 | Admitting: Clinical

## 2022-11-13 DIAGNOSIS — F419 Anxiety disorder, unspecified: Secondary | ICD-10-CM | POA: Diagnosis not present

## 2022-11-13 NOTE — BH Specialist Note (Unsigned)
Integrated Behavioral Health Initial In-Person Visit  MRN: [id number] Name: Mady Breaux  Number of Integrated Behavioral Health Clinician visits: 1- Initial Visit  Session Start time: 1101    Session End time: 1158  Total time in minutes: 57   Types of Service: Individual psychotherapy  Interpretor:No. Interpretor Name and Language: none  Subjective: Crystal Cunningham is a 32 y.o. female accompanied by  self. Patient was referred by Edwin Dada, NP for anxiety. Patient reports the following symptoms/concerns: anxiety, irritability Duration of problem: several years; Severity of problem: moderate  Objective: Mood: Euthymic and Affect: Appropriate Risk of harm to self or others: No plan to harm self or others  Life Context: Family and Social: Patient's mom lives in the area, most other family lives back in [place] where patient is from. School/Work: Patient works two jobs. She was in school for psychology/sociology, but financial barriers kept her from completing program. She would like to return to school and finish her degree.  Self-Care:  Life Changes:   Patient and/or Family's Strengths/Protective Factors: Social connections, Social and Emotional competence, Concrete supports in place (healthy food, safe environments, etc.), Sense of purpose, and Physical Health (exercise, healthy diet, medication compliance, etc.)  Goals Addressed: Patient will: Reduce symptoms of: anxiety Increase knowledge and/or ability of: coping skills and self-management skills  Demonstrate ability to: Increase healthy adjustment to current life circumstances  Progress towards Goals: Ongoing  Interventions: Interventions utilized: CBT Cognitive Behavioral Therapy and Supportive Counseling  Standardized Assessments completed: Not Needed  Irritability, heart racing, anxiety  Needing to say no, set boundaries Belief that she's doing something wrong  Feeling stuck  FAFSA/ financial  aid info  Balance left to pay at [facility]  Patient and/or Family Response: Patient engaged in session.   Patient Centered Plan: Patient is on the following Treatment Plan(s):  CBT for anxiety  Assessment: Patient currently experiencing anxiety and possibly depression exacerbated by work and other life stressors.    Patient may benefit from CBT to explore beliefs and thoughts about self and others that may be exacerbating anxiety and depression. She may also benefit from continued boundary setting and reflecting on her own needs in relationship with others.   Plan: Follow up with behavioral health clinician on: 11/27/22 Referral(s): Integrated [employer] (In Clinic)  Abigail Butts, LCSW

## 2022-11-26 ENCOUNTER — Ambulatory Visit: Payer: Self-pay | Admitting: Nurse Practitioner

## 2022-11-27 ENCOUNTER — Ambulatory Visit: Payer: Self-pay | Admitting: Clinical

## 2022-11-27 ENCOUNTER — Ambulatory Visit (INDEPENDENT_AMBULATORY_CARE_PROVIDER_SITE_OTHER): Payer: 59 | Admitting: Nurse Practitioner

## 2022-11-27 ENCOUNTER — Ambulatory Visit (INDEPENDENT_AMBULATORY_CARE_PROVIDER_SITE_OTHER): Payer: Self-pay | Admitting: Clinical

## 2022-11-27 ENCOUNTER — Encounter: Payer: Self-pay | Admitting: Nurse Practitioner

## 2022-11-27 VITALS — BP 121/64 | HR 71 | Wt 221.8 lb

## 2022-11-27 DIAGNOSIS — Z0000 Encounter for general adult medical examination without abnormal findings: Secondary | ICD-10-CM | POA: Diagnosis not present

## 2022-11-27 DIAGNOSIS — Z6841 Body Mass Index (BMI) 40.0 and over, adult: Secondary | ICD-10-CM

## 2022-11-27 DIAGNOSIS — M79672 Pain in left foot: Secondary | ICD-10-CM | POA: Diagnosis not present

## 2022-11-27 DIAGNOSIS — M79671 Pain in right foot: Secondary | ICD-10-CM

## 2022-11-27 DIAGNOSIS — G8929 Other chronic pain: Secondary | ICD-10-CM

## 2022-11-27 DIAGNOSIS — F419 Anxiety disorder, unspecified: Secondary | ICD-10-CM

## 2022-11-27 DIAGNOSIS — E669 Obesity, unspecified: Secondary | ICD-10-CM | POA: Insufficient documentation

## 2022-11-27 DIAGNOSIS — Z13228 Encounter for screening for other metabolic disorders: Secondary | ICD-10-CM

## 2022-11-27 HISTORY — DX: Body Mass Index (BMI) 40.0 and over, adult: Z684

## 2022-11-27 HISTORY — DX: Encounter for general adult medical examination without abnormal findings: Z00.00

## 2022-11-27 MED ORDER — PHENTERMINE HCL 15 MG PO CAPS
15.0000 mg | ORAL_CAPSULE | ORAL | 0 refills | Status: DC
Start: 2022-11-27 — End: 2022-12-31

## 2022-11-27 MED ORDER — SERTRALINE HCL 25 MG PO TABS: 25.0000 mg | ORAL_TABLET | Freq: Every day | ORAL | 1 refills | Status: AC

## 2022-11-27 MED ORDER — METFORMIN HCL ER 500 MG PO TB24: 500.0000 mg | ORAL_TABLET | Freq: Every day | ORAL | 1 refills | Status: AC

## 2022-11-27 NOTE — Assessment & Plan Note (Signed)
Wt Readings from Last 3 Encounters:  11/27/22 221 lb 12.8 oz (100.6 kg)  10/15/22 225 lb 9.6 oz (102.3 kg)   Body mass index is 40.57 kg/m.  Patient counseled on low-carb modified diet Encouraged engage in regular moderate to vigorous exercise at least 150 minutes weekly She would like to try phentermine for short time I discussed the addictive nature of the medication and common side effects including hypertension and palpitations Start phentermine 15 mg daily, metformin 500 mg daily Benefits of healthy weights discussed Follow-up in 4 weeks

## 2022-11-27 NOTE — Assessment & Plan Note (Signed)
Annual exam as documented.  Counseling done include healthy lifestyle involving committing to 150 minutes of exercise per week, heart healthy diet, and attaining healthy weight. Changes in health habits are decided on by patient with goals and time frames set for achieving them. Immunization and cancer screening  needs are specifically addressed at this visit.   Up-to-date with cervical cancer screening Screening for hepatitis C and lipid disorder Encouraged to get HPV vaccine at the pharmacy

## 2022-11-27 NOTE — BH Specialist Note (Signed)
Integrated Behavioral Health Follow Up In-Person Visit  MRN: [id number] Name: Crystal Cunningham  Number of Integrated Behavioral Health Clinician visits: 2- Second Visit  Session Start time: 1535   Session End time: 1605  Total time in minutes: 30   Types of Service: Individual psychotherapy  Interpretor:No. Interpretor Name and Language: none  Subjective: Crystal Cunningham is a 32 y.o. female  Patient was referred by Edwin Dada, NP for anxiety. Patient reports the following symptoms/concerns: anxiety, irritability Duration of problem: several years; Severity of problem: moderate  Objective: Mood: Euthymic and Affect: Appropriate Risk of harm to self or others: No plan to harm self or others  Patient and/or Family's Strengths/Protective Factors: Social connections, Social and Emotional competence, Concrete supports in place (healthy food, safe environments, etc.), Sense of purpose, and Physical Health (exercise, healthy diet, medication compliance, etc.)   Goals Addressed: Patient will:  Reduce symptoms of: anxiety Increase knowledge and/or ability of: coping skills and self-management skills  Demonstrate ability to: Increase healthy adjustment to current life circumstances  Progress towards Goals: Ongoing  Interventions: Interventions utilized:  Supportive Counseling Standardized Assessments completed: Not Needed  Patient reported her anxiety is better lately. Stresses at work have calmed down. She is looking forward to visiting family during the holidays, though is also anxious about interactions with certain family members. Brief supportive counseling around this. Advised patient that CSW is leaving the practice at the end of the month and discussed referral to another therapist. Provided patient with list of therapists in network with her insurance.   Patient and/or Family Response: Patient engaged in session.   Assessment: Patient currently experiencing anxiety  and possibly depression exacerbated by work and other life stressors.    Patient may benefit from CBT to explore beliefs and thoughts about self and others that may be exacerbating anxiety and depression. She may also benefit from continued boundary setting and reflecting on her own needs in relationship with others.   Plan: Follow up with behavioral health clinician on: 12/10/22 Referral(s): Counselor  Abigail Butts, LCSW

## 2022-11-27 NOTE — Assessment & Plan Note (Signed)
Doing well on sertraline 25 mg daily and therapy Continue current medication Encouraged to maintain close follow-up with a therapist    11/27/2022    2:51 PM 10/15/2022    1:45 PM  GAD 7 : Generalized Anxiety Score  Nervous, Anxious, on Edge 1 1  Control/stop worrying 0 1  Worry too much - different things 1 1  Trouble relaxing 1 2  Restless 0 1  Easily annoyed or irritable 1 1  Afraid - awful might happen 0 0  Total GAD 7 Score 4 7  Anxiety Difficulty Not difficult at all Somewhat difficult

## 2022-11-27 NOTE — Progress Notes (Signed)
Complete physical exam  Patient: Crystal Cunningham   DOB: 12/14/1990   32 y.o. Female  MRN: [id number]  Subjective:    Chief Complaint  Patient presents with   Anxiety    Crystal Cunningham is a 32 y.o. female  has a past medical history of Allergies (10/15/2022), Allergy, Annual physical exam (11/27/2022), Anxiety, Arthritis, BMI 40.0-44.9, adult (HCC) (11/27/2022), Chronic pain of both feet (10/15/2022), and GERD (gastroesophageal reflux disease). who presents today for a complete physical exam. She reports consuming a general diet. The patient has a physically strenuous job, but has no regular exercise apart from work.  She generally feels fairly well. She reports sleeping well. She does not have additional problems to discuss today.   Anxiety , states that Zoloft 25 mg daily makes her feel so much better, states that therapy is also working.  Has an appointment with a therapist today   Obesity.  Patient was on phentermine 37.5 mg daily and metformin 500 mg daily in the past.  [place] Spaniel that she was able to lose some weight while on the medications.  She would like to try the medication again for short time.  She has upcoming appointment with the podiatrist on November 14.  She has been taking ibuprofen as needed for her foot pain.  Patient encouraged to get the HPV vaccine at the pharmacy    Most recent fall risk assessment:     No data to display           Most recent depression screenings:    10/15/2022    1:45 PM  PHQ 2/9 Scores  PHQ - 2 Score 0        Patient Care Team: Donell Beers, FNP as PCP - General (Nurse Practitioner)   Outpatient Medications Prior to Visit  Medication Sig   albuterol (VENTOLIN HFA) 108 (90 Base) MCG/ACT inhaler Inhale 2 puffs into the lungs every 6 (six) hours as needed.   etonogestrel (NEXPLANON) 68 MG IMPL implant    fluticasone (FLONASE) 50 MCG/ACT nasal spray Place 2 sprays into both nostrils daily.   ibuprofen (ADVIL) 800 MG tablet  Take 1 tablet (800 mg total) by mouth every 8 (eight) hours as needed.   loratadine (CLARITIN) 10 MG tablet Take 1 tablet (10 mg total) by mouth daily.   sodium chloride (OCEAN) 0.65 % SOLN nasal spray Place 1 spray into both nostrils as needed for congestion.   tretinoin (RETIN-A) 0.025 % cream Apply a pea sized amount to the entire face at nighttime.   [DISCONTINUED] sertraline (ZOLOFT) 25 MG tablet Take 1 tablet (25 mg total) by mouth daily.   gabapentin (NEURONTIN) 300 MG capsule Take 1 capsule (300 mg total) by mouth 3 (three) times daily. (Patient not taking: Reported on 10/15/2022)   No facility-administered medications prior to visit.    Review of Systems  Constitutional:  Negative for appetite change, chills, fatigue and fever.  HENT:  Negative for congestion, postnasal drip, rhinorrhea and sneezing.   Eyes:  Negative for pain, discharge and itching.  Respiratory:  Negative for cough, shortness of breath and wheezing.   Cardiovascular:  Negative for chest pain, palpitations and leg swelling.  Gastrointestinal:  Negative for abdominal pain, constipation, nausea and vomiting.  Endocrine: Negative for cold intolerance, heat intolerance and polydipsia.  Genitourinary:  Negative for difficulty urinating, dysuria, flank pain and frequency.  Musculoskeletal:  Negative for arthralgias, back pain, joint swelling and myalgias.  Skin:  Negative for color change, pallor, rash and  wound.  Neurological:  Negative for dizziness, facial asymmetry, weakness, numbness and headaches.  Psychiatric/Behavioral:  Negative for behavioral problems, confusion, self-injury and suicidal ideas.        Objective:     BP 121/64   Pulse 71   Wt 221 lb 12.8 oz (100.6 kg)   SpO2 100%   BMI 40.57 kg/m    Physical Exam Vitals and nursing note reviewed. Exam conducted with a chaperone present.  Constitutional:      General: She is not in acute distress.    Appearance: Normal appearance. She is obese. She  is not ill-appearing, toxic-appearing or diaphoretic.  HENT:     Right Ear: Tympanic membrane, ear canal and external ear normal. There is no impacted cerumen.     Left Ear: Tympanic membrane, ear canal and external ear normal. There is no impacted cerumen.     Nose: Nose normal. No congestion or rhinorrhea.     Mouth/Throat:     Mouth: Mucous membranes are moist.     Pharynx: Oropharynx is clear. No oropharyngeal exudate or posterior oropharyngeal erythema.  Eyes:     General: No scleral icterus.       Right eye: No discharge.        Left eye: No discharge.     Extraocular Movements: Extraocular movements intact.     Conjunctiva/sclera: Conjunctivae normal.  Neck:     Vascular: No carotid bruit.  Cardiovascular:     Rate and Rhythm: Normal rate and regular rhythm.     Pulses: Normal pulses.     Heart sounds: Normal heart sounds. No murmur heard.    No friction rub. No gallop.  Pulmonary:     Effort: Pulmonary effort is normal. No respiratory distress.     Breath sounds: Normal breath sounds. No stridor. No wheezing, rhonchi or rales.  Chest:     Chest wall: No mass, lacerations, deformity, swelling, tenderness, crepitus or edema. There is no dullness to percussion.  Breasts:    Tanner Score is 5.     Right: No swelling, bleeding, inverted nipple, mass, nipple discharge, skin change or tenderness.     Left: No swelling, bleeding, inverted nipple, mass, nipple discharge, skin change or tenderness.  Abdominal:     General: Bowel sounds are normal. There is no distension.     Palpations: Abdomen is soft. There is no mass.     Tenderness: There is no abdominal tenderness. There is no right CVA tenderness, left CVA tenderness, guarding or rebound.     Hernia: No hernia is present.  Musculoskeletal:        General: No swelling, tenderness, deformity or signs of injury.     Cervical back: Normal range of motion and neck supple. No rigidity or tenderness.     Right lower leg: No edema.      Left lower leg: No edema.  Lymphadenopathy:     Cervical: No cervical adenopathy.     Upper Body:     Right upper body: No supraclavicular, axillary or pectoral adenopathy.     Left upper body: No supraclavicular or axillary adenopathy.  Skin:    General: Skin is warm and dry.     Capillary Refill: Capillary refill takes less than 2 seconds.     Coloration: Skin is not jaundiced or pale.     Findings: No bruising, erythema, lesion or rash.  Neurological:     Mental Status: She is alert and oriented to person, place, and time.  Cranial Nerves: No cranial nerve deficit.     Sensory: No sensory deficit.     Motor: No weakness.     Coordination: Coordination normal.     Gait: Gait normal.     Deep Tendon Reflexes: Reflexes normal.  Psychiatric:        Mood and Affect: Mood normal.        Behavior: Behavior normal.        Thought Content: Thought content normal.        Judgment: Judgment normal.     No results found for any visits on 11/27/22.     Assessment & Plan:    Routine Health Maintenance and Physical Exam  Immunization History  Administered Date(s) Administered   HPV Quadrivalent 04/02/2006, 06/03/2006   Influenza-Unspecified 11/22/2022   Meningococcal Conjugate 12/26/2004   PPD Test 06/19/2017   Tdap 12/26/2004, 08/03/2015    Health Maintenance  Topic Date Due   HPV VACCINES (3 - 3-dose series) 10/03/2006   Hepatitis C Screening  Never done   COVID-19 Vaccine (1 - 2023-24 season) Never done   DTaP/Tdap/Td (3 - Td or Tdap) 08/02/2025   Cervical Cancer Screening (HPV/Pap Cotest)  03/13/2026   INFLUENZA VACCINE  Completed   HIV Screening  Discontinued    Discussed health benefits of physical activity, and encouraged her to engage in regular exercise appropriate for her age and condition.  Problem List Items Addressed This Visit       Other   Anxiety    Doing well on sertraline 25 mg daily and therapy Continue current medication Encouraged to  maintain close follow-up with a therapist    11/27/2022    2:51 PM 10/15/2022    1:45 PM  GAD 7 : Generalized Anxiety Score  Nervous, Anxious, on Edge 1 1  Control/stop worrying 0 1  Worry too much - different things 1 1  Trouble relaxing 1 2  Restless 0 1  Easily annoyed or irritable 1 1  Afraid - awful might happen 0 0  Total GAD 7 Score 4 7  Anxiety Difficulty Not difficult at all Somewhat difficult          Relevant Medications   sertraline (ZOLOFT) 25 MG tablet   Chronic pain of both feet    Continue ibuprofen as needed Follow-up with podiatry as planned      Relevant Medications   sertraline (ZOLOFT) 25 MG tablet   BMI 40.0-44.9, adult (HCC)    Wt Readings from Last 3 Encounters:  11/27/22 221 lb 12.8 oz (100.6 kg)  10/15/22 225 lb 9.6 oz (102.3 kg)   Body mass index is 40.57 kg/m.  Patient counseled on low-carb modified diet Encouraged engage in regular moderate to vigorous exercise at least 150 minutes weekly She would like to try phentermine for short time I discussed the addictive nature of the medication and common side effects including hypertension and palpitations Start phentermine 15 mg daily, metformin 500 mg daily Benefits of healthy weights discussed Follow-up in 4 weeks      Relevant Medications   phentermine 15 MG capsule   metFORMIN (GLUCOPHAGE-XR) 500 MG 24 hr tablet   Annual physical exam - Primary    Annual exam as documented.  Counseling done include healthy lifestyle involving committing to 150 minutes of exercise per week, heart healthy diet, and attaining healthy weight. Changes in health habits are decided on by patient with goals and time frames set for achieving them. Immunization and cancer screening  needs are specifically  addressed at this visit.   Up-to-date with cervical cancer screening Screening for hepatitis C and lipid disorder Encouraged to get HPV vaccine at the pharmacy      Other Visit Diagnoses     Screening for  endocrine, nutritional, metabolic and immunity disorder       Relevant Orders   Hepatitis C antibody   Lipid panel      Return in about 4 weeks (around 12/25/2022) for obesity, FASTING LABS THIS WEEK.     Donell Beers, FNP

## 2022-11-27 NOTE — Assessment & Plan Note (Signed)
Continue ibuprofen as needed Follow-up with podiatry as planned

## 2022-11-27 NOTE — Patient Instructions (Signed)
1. BMI 40.0-44.9, adult (HCC)  - phentermine 15 MG capsule; Take 1 capsule (15 mg total) by mouth every morning.  Dispense: 30 capsule; Refill: 0 - metFORMIN (GLUCOPHAGE-XR) 500 MG 24 hr tablet; Take 1 tablet (500 mg total) by mouth daily with breakfast.  Dispense: 90 tablet; Refill: 1  2. Anxiety   3. Annual physical exam   4. Screening for endocrine, nutritional, metabolic and immunity disorder  - Hepatitis C antibody; Future - Lipid panel; Future    It is important that you exercise regularly at least 30 minutes 5 times a week as tolerated  Think about what you will eat, plan ahead. Choose " clean, green, fresh or frozen" over canned, processed or packaged foods which are more sugary, salty and fatty. 70 to 75% of food eaten should be vegetables and fruit. Three meals at set times with snacks allowed between meals, but they must be fruit or vegetables. Aim to eat over a 12 hour period , example 7 am to 7 pm, and STOP after  your last meal of the day. Drink water,generally about 64 ounces per day, no other drink is as healthy. Fruit juice is best enjoyed in a healthy way, by EATING the fruit.  Thanks for choosing [facility] we consider it a privelige to serve you.

## 2022-12-06 ENCOUNTER — Ambulatory Visit (INDEPENDENT_AMBULATORY_CARE_PROVIDER_SITE_OTHER): Payer: 59 | Admitting: Podiatry

## 2022-12-06 ENCOUNTER — Ambulatory Visit (INDEPENDENT_AMBULATORY_CARE_PROVIDER_SITE_OTHER): Payer: 59

## 2022-12-06 ENCOUNTER — Encounter: Payer: Self-pay | Admitting: Podiatry

## 2022-12-06 DIAGNOSIS — R2243 Localized swelling, mass and lump, lower limb, bilateral: Secondary | ICD-10-CM

## 2022-12-06 DIAGNOSIS — M778 Other enthesopathies, not elsewhere classified: Secondary | ICD-10-CM

## 2022-12-06 DIAGNOSIS — M87076 Idiopathic aseptic necrosis of unspecified foot: Secondary | ICD-10-CM

## 2022-12-06 DIAGNOSIS — S92252P Displaced fracture of navicular [scaphoid] of left foot, subsequent encounter for fracture with malunion: Secondary | ICD-10-CM

## 2022-12-06 MED ORDER — DICLOFENAC SODIUM 75 MG PO TBEC: 75.0000 mg | DELAYED_RELEASE_TABLET | Freq: Two times a day (BID) | ORAL | 2 refills | Status: AC
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
## 2022-12-10 ENCOUNTER — Ambulatory Visit (INDEPENDENT_AMBULATORY_CARE_PROVIDER_SITE_OTHER): Payer: Self-pay | Admitting: Clinical

## 2022-12-10 DIAGNOSIS — Z130 Encounter for screening for diseases of the blood and blood-forming organs and certain disorders involving the immune mechanism: Secondary | ICD-10-CM

## 2022-12-10 DIAGNOSIS — F419 Anxiety disorder, unspecified: Secondary | ICD-10-CM

## 2022-12-10 NOTE — BH Specialist Note (Signed)
Integrated Behavioral Health Follow Up In-Person Visit  MRN: [id number] Name: Crystal Cunningham  Number of Integrated Behavioral Health Clinician visits: 3- Third Visit  Session Start time: 1405   Session End time: 1425  Total time in minutes: 20   Types of Service: Individual psychotherapy  Interpretor:No. Interpretor Name and Language: none  Subjective: Crystal Cunningham is a 32 y.o. female  Patient was referred by Edwin Dada, NP for anxiety. Patient reports the following symptoms/concerns: anxiety, irritability Duration of problem: several years; Severity of problem: moderate  Objective: Mood: Euthymic and Affect: Appropriate Risk of harm to self or others: No plan to harm self or others  Patient and/or Family's Strengths/Protective Factors: Social connections, Social and Emotional competence, Concrete supports in place (healthy food, safe environments, etc.), Sense of purpose, and Physical Health (exercise, healthy diet, medication compliance, etc.)   Goals Addressed: Patient will:  RReduce symptoms of: anxiety Increase knowledge and/or ability of: coping skills and self-management skills  Demonstrate ability to: Increase healthy adjustment to current life circumstances  Progress towards Goals: Discontinued  Interventions: Interventions utilized:  Supportive Counseling Standardized Assessments completed: Not Needed  Supportive counseling today. Patient reports things have still been going fairly well with work and personal life. 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 Her anxiety has been manageable. Reflected on goals that patient would like to work on with new therapist. Patient wants to explore how to manage challenging interpersonal interactions and develop more discernment about healthy relationships. Patient did report that she has made appointments with two different therapists and will see which one feels like a better fit.  Patient and/or Family Response:  Patient engaged in session.  Assessment: Patient currently experiencing anxiety and possibly depression exacerbated by work and other life stressors.    Patient may benefit from CBT to explore beliefs and thoughts about self and others that may be exacerbating anxiety and depression. She may also benefit from continued boundary setting and reflecting on her own needs in relationship with others.  Plan: Follow up with behavioral health clinician on: Treatment terminated as CSW is leaving the practice. Patient is scheduled with new counselors. Referral(s): Counselor  Abigail Butts, LCSW

## 2022-12-11 LAB — LIPID PANEL
Chol/HDL Ratio: 2.5 ratio (ref 0.0–4.4)
Cholesterol, Total: 176 mg/dL (ref 100–199)
HDL: 70 mg/dL (ref >39)
LDL Chol Calc (NIH): 94 mg/dL (ref 0–99)
Triglycerides: 65 mg/dL (ref 0–149)
VLDL Cholesterol Cal: 12 mg/dL (ref 5–40)

## 2022-12-11 LAB — HEPATITIS C ANTIBODY: Hep C Virus Ab: NONREACTIVE

## 2022-12-24 ENCOUNTER — Encounter: Payer: Self-pay | Admitting: Podiatry

## 2022-12-24 NOTE — Addendum Note (Signed)
Addended byLilian Kapur, Nicholos Aloisi R on: 12/24/2022 04:52 PM   Modules accepted: Orders

## 2022-12-25 ENCOUNTER — Encounter: Payer: Self-pay | Admitting: Podiatry

## 2022-12-25 ENCOUNTER — Ambulatory Visit: Payer: 59 | Admitting: Nurse Practitioner

## 2022-12-28 ENCOUNTER — Ambulatory Visit
Admission: RE | Admit: 2022-12-28 | Discharge: 2022-12-28 | Disposition: A | Discharge status: Home / Self Care | Payer: 59 | Source: Ambulatory Visit | Attending: Podiatry

## 2022-12-28 DIAGNOSIS — M87076 Idiopathic aseptic necrosis of unspecified foot: Secondary | ICD-10-CM

## 2022-12-28 DIAGNOSIS — S92252P Displaced fracture of navicular [scaphoid] of left foot, subsequent encounter for fracture with malunion: Secondary | ICD-10-CM

## 2022-12-31 ENCOUNTER — Encounter: Payer: Self-pay | Admitting: Nurse Practitioner

## 2022-12-31 ENCOUNTER — Ambulatory Visit: Payer: 59 | Admitting: Nurse Practitioner

## 2022-12-31 VITALS — BP 124/67 | HR 74 | Temp 97.6°F | Wt 217.8 lb

## 2022-12-31 DIAGNOSIS — M79671 Pain in right foot: Secondary | ICD-10-CM | POA: Diagnosis not present

## 2022-12-31 DIAGNOSIS — M79672 Pain in left foot: Secondary | ICD-10-CM

## 2022-12-31 DIAGNOSIS — G8929 Other chronic pain: Secondary | ICD-10-CM

## 2022-12-31 DIAGNOSIS — Z6841 Body Mass Index (BMI) 40.0 and over, adult: Secondary | ICD-10-CM | POA: Diagnosis not present

## 2022-12-31 MED ORDER — PHENTERMINE HCL 37.5 MG PO CAPS: 37.5000 mg | ORAL_CAPSULE | ORAL | 0 refills | Status: DC

## 2022-12-31 NOTE — Assessment & Plan Note (Addendum)
Wt Readings from Last 3 Encounters:  12/31/22 217 lb 12.8 oz (98.8 kg)  11/27/22 221 lb 12.8 oz (100.6 kg)  10/15/22 225 lb 9.6 oz (102.3 kg)   Body mass index is 39.84 kg/m.  Patient has lost about 4 pounds since her last visit, patient congratulated on her efforts at losing weight Patient counseled on low-carb modified diet Encouraged to engage in regular moderate exercises at least 150 minutes weekly as tolerated Start phentermine 37.5 mg daily Continue metformin 500 mg daily Follow-up in 3 months

## 2022-12-31 NOTE — Assessment & Plan Note (Signed)
Continue diclofenac 75 mg twice daily has upcoming MRI of the feet Encouraged to maintain close follow-up with a podiatrist

## 2022-12-31 NOTE — Patient Instructions (Signed)
1. BMI 40.0-44.9, adult (HCC)  - phentermine 37.5 MG capsule; Take 1 capsule (37.5 mg total) by mouth every morning.  Dispense: 30 capsule; Refill: 0   It is important that you exercise regularly at least 30 minutes 5 times a week as tolerated  Think about what you will eat, plan ahead. Choose " clean, green, fresh or frozen" over canned, processed or packaged foods which are more sugary, salty and fatty. 70 to 75% of food eaten should be vegetables and fruit. Three meals at set times with snacks allowed between meals, but they must be fruit or vegetables. Aim to eat over a 12 hour period , example 7 am to 7 pm, and STOP after  your last meal of the day. Drink water,generally about 64 ounces per day, no other drink is as healthy. Fruit juice is best enjoyed in a healthy way, by EATING the fruit.  Thanks for choosing [facility] we consider it a privelige to serve you.

## 2022-12-31 NOTE — Progress Notes (Signed)
Established Patient Office Visit  Subjective:  Patient ID: Crystal Cunningham, female    DOB: October 26, 1990  Age: 32 y.o. MRN: [id number]  CC:  Chief Complaint  Patient presents with   Weight Loss    Weight loss follow up    HPI Crystal Cunningham is a 32 y.o. female  has a past medical history of Allergies (10/15/2022), Allergy, Annual physical exam (11/27/2022), Anxiety, Arthritis, BMI 40.0-44.9, adult (HCC) (11/27/2022), Chronic pain of both feet (10/15/2022), and GERD (gastroesophageal reflux disease).  Patient presents for follow-up for obesity  Obesity.  She is currently on metformin 500 mg daily, phentermine 15 mg daily, she has started walking twice a week 2 hours on each day, now mainly on a high protein diet.  Stated that the podiatrist does not want her engaging in vigorous exercises for now.  Chronic pain of both foot she has an upcoming MRI for her chronic bilateral feet pain taking diclofenac 75 mg twice daily.  States that her pain and swelling is much better. patient told not to take ibuprofen while taking diclofenac she verbalized understanding.  Patient denies any adverse reactions to current medications  Past Medical History:  Diagnosis Date   Allergies 10/15/2022   Allergy    Fish   Annual physical exam 11/27/2022   Anxiety    Arthritis    BMI 40.0-44.9, adult (HCC) 11/27/2022   Chronic pain of both feet 10/15/2022   GERD (gastroesophageal reflux disease)     History reviewed. No pertinent surgical history.  Family History  Problem Relation Age of Onset   Arthritis Mother    Diabetes Mother    Hypertension Mother    Obesity Mother    Varicose Veins Mother    Hypothyroidism Mother    Asthma Father    Hyperlipidemia Father    High blood pressure Father     Social History   Socioeconomic History   Marital status: Single    Spouse name: Not on file   Number of children: Not on file   Years of education: Not on file   Highest education level: Some college,  no degree  Occupational History   Not on file  Tobacco Use   Smoking status: Never   Smokeless tobacco: Not on file  Substance and Sexual Activity   Alcohol use: Yes    Alcohol/week: 3.0 standard drinks of alcohol    Types: 1 Glasses of wine, 2 Cans of beer per week    Comment: occasionally   Drug use: Never   Sexual activity: Not Currently    Birth control/protection: Inserts  Other Topics Concern   Not on file  Social History Narrative   Lives home alone    Social Determinants of Health   Financial Resource Strain: Low Risk  (11/26/2022)   Overall Financial Resource Strain (CARDIA)    Difficulty of Paying Living Expenses: Not very hard  Food Insecurity: No Food Insecurity (11/26/2022)   Hunger Vital Sign    Worried About Running Out of Food in the Last Year: Never true    Ran Out of Food in the Last Year: Never true  Transportation Needs: No Transportation Needs (11/26/2022)   PRAPARE - Administrator, Civil Service (Medical): No    Lack of Transportation (Non-Medical): No  Physical Activity: Insufficiently Active (11/26/2022)   Exercise Vital Sign    Days of Exercise per Week: 3 days    Minutes of Exercise per Session: 30 min  Stress: Stress Concern Present (11/26/2022)  [employer] of Occupational Health - Occupational Stress Questionnaire    Feeling of Stress : To some extent  Social Connections: Unknown (11/26/2022)   Social Connection and Isolation Panel [NHANES]    Frequency of Communication with Friends and Family: Three times a week    Frequency of Social Gatherings with Friends and Family: Once a week    Attends Religious Services: Patient declined    Database administrator or Organizations: No    Attends Engineer, structural: Not on file    Marital Status: Never married  Intimate Partner Violence: Not on file    Outpatient Medications Prior to Visit  Medication Sig Dispense Refill   albuterol (VENTOLIN HFA) 108 (90 Base) MCG/ACT  inhaler Inhale 2 puffs into the lungs every 6 (six) hours as needed. 1 each 1   diclofenac (VOLTAREN) 75 MG EC tablet Take 1 tablet (75 mg total) by mouth 2 (two) times daily. 60 tablet 2   etonogestrel (NEXPLANON) 68 MG IMPL implant      fluticasone (FLONASE) 50 MCG/ACT nasal spray Place 2 sprays into both nostrils daily. 11.1 mL 3   ibuprofen (ADVIL) 800 MG tablet Take 1 tablet (800 mg total) by mouth every 8 (eight) hours as needed. 30 tablet 1   loratadine (CLARITIN) 10 MG tablet Take 1 tablet (10 mg total) by mouth daily. 90 tablet 1   metFORMIN (GLUCOPHAGE-XR) 500 MG 24 hr tablet Take 1 tablet (500 mg total) by mouth daily with breakfast. 90 tablet 1   sertraline (ZOLOFT) 25 MG tablet Take 1 tablet (25 mg total) by mouth daily. 90 tablet 1   sodium chloride (OCEAN) 0.65 % SOLN nasal spray Place 1 spray into both nostrils as needed for congestion. 15 mL 1   tretinoin (RETIN-A) 0.025 % cream Apply a pea sized amount to the entire face at nighttime.     phentermine 15 MG capsule Take 1 capsule (15 mg total) by mouth every morning. 30 capsule 0   gabapentin (NEURONTIN) 300 MG capsule Take 1 capsule (300 mg total) by mouth 3 (three) times daily. (Patient not taking: Reported on 12/31/2022) 90 capsule 3   No facility-administered medications prior to visit.    Allergies  Allergen Reactions   Fish-Derived Products Swelling    Pt stated that she "swells up and gets hives."   Sulfa Antibiotics Hives and Swelling    Pt stated that she "swells up and gets hives."    Fish Allergy Hives    ROS Review of Systems  Constitutional:  Negative for appetite change, chills, fatigue and fever.  HENT:  Negative for congestion, postnasal drip, rhinorrhea and sneezing.   Respiratory:  Negative for cough, shortness of breath and wheezing.   Cardiovascular:  Negative for chest pain, palpitations and leg swelling.  Gastrointestinal:  Negative for abdominal pain, constipation, nausea and vomiting.   Genitourinary:  Negative for difficulty urinating, dysuria, flank pain and frequency.  Musculoskeletal:  Negative for arthralgias, back pain, joint swelling and myalgias.  Skin:  Negative for color change, pallor, rash and wound.  Neurological:  Negative for dizziness, facial asymmetry, weakness, numbness and headaches.  Psychiatric/Behavioral:  Negative for behavioral problems, confusion, self-injury and suicidal ideas.       Objective:    Physical Exam Vitals and nursing note reviewed.  Constitutional:      General: She is not in acute distress.    Appearance: Normal appearance. She is obese. She is not ill-appearing, toxic-appearing or diaphoretic.  HENT:  Mouth/Throat:     Mouth: Mucous membranes are moist.     Pharynx: Oropharynx is clear. No oropharyngeal exudate or posterior oropharyngeal erythema.  Eyes:     General: No scleral icterus.       Right eye: No discharge.        Left eye: No discharge.     Extraocular Movements: Extraocular movements intact.     Conjunctiva/sclera: Conjunctivae normal.  Cardiovascular:     Rate and Rhythm: Normal rate and regular rhythm.     Pulses: Normal pulses.     Heart sounds: Normal heart sounds. No murmur heard.    No friction rub. No gallop.  Pulmonary:     Effort: Pulmonary effort is normal. No respiratory distress.     Breath sounds: Normal breath sounds. No stridor. No wheezing, rhonchi or rales.  Chest:     Chest wall: No tenderness.  Abdominal:     General: There is no distension.     Palpations: Abdomen is soft.     Tenderness: There is no abdominal tenderness. There is no right CVA tenderness, left CVA tenderness or guarding.  Musculoskeletal:        General: No deformity or signs of injury.     Right lower leg: No edema.     Left lower leg: No edema.  Skin:    General: Skin is warm and dry.     Capillary Refill: Capillary refill takes less than 2 seconds.     Coloration: Skin is not jaundiced or pale.      Findings: No bruising, erythema or lesion.  Neurological:     Mental Status: She is alert and oriented to person, place, and time.     Motor: No weakness.     Coordination: Coordination normal.     Gait: Gait normal.  Psychiatric:        Mood and Affect: Mood normal.        Behavior: Behavior normal.        Thought Content: Thought content normal.        Judgment: Judgment normal.     BP 124/67   Pulse 74   Temp 97.6 F (36.4 C)   Wt 217 lb 12.8 oz (98.8 kg)   SpO2 97%   BMI 39.84 kg/m  Wt Readings from Last 3 Encounters:  12/31/22 217 lb 12.8 oz (98.8 kg)  11/27/22 221 lb 12.8 oz (100.6 kg)  10/15/22 225 lb 9.6 oz (102.3 kg)    No results found for: "TSH" Lab Results  Component Value Date   WBC 9.1 10/19/2022   HGB 11.5 10/19/2022   HCT 36.3 10/19/2022   MCV 86 10/19/2022   PLT 212 10/19/2022   Lab Results  Component Value Date   NA 140 10/19/2022   K 4.4 10/19/2022   CO2 21 10/19/2022   GLUCOSE 87 10/19/2022   BUN 12 10/19/2022   CREATININE 0.67 10/19/2022   BILITOT 0.3 10/19/2022   ALKPHOS 61 10/19/2022   AST 13 10/19/2022   ALT 9 10/19/2022   PROT 6.2 10/19/2022   ALBUMIN 3.9 10/19/2022   CALCIUM 8.5 (L) 10/19/2022   EGFR 118 10/19/2022   Lab Results  Component Value Date   CHOL 176 12/10/2022   Lab Results  Component Value Date   HDL 70 12/10/2022   Lab Results  Component Value Date   LDLCALC 94 12/10/2022   Lab Results  Component Value Date   TRIG 65 12/10/2022   Lab Results  Component  Value Date   CHOLHDL 2.5 12/10/2022   No results found for: "HGBA1C"    Assessment & Plan:   Problem List Items Addressed This Visit       Other   Chronic pain of both feet    Continue diclofenac 75 mg twice daily has upcoming MRI of the feet Encouraged to maintain close follow-up with a podiatrist      BMI 40.0-44.9, adult (HCC) - Primary    Wt Readings from Last 3 Encounters:  12/31/22 217 lb 12.8 oz (98.8 kg)  11/27/22 221 lb 12.8 oz  (100.6 kg)  10/15/22 225 lb 9.6 oz (102.3 kg)   Body mass index is 39.84 kg/m.  Patient has lost about 4 pounds since her last visit, patient congratulated on her efforts at losing weight Patient counseled on low-carb modified diet Encouraged to engage in regular moderate exercises at least 150 minutes weekly as tolerated Start phentermine 37.5 mg daily Continue metformin 500 mg daily Follow-up in 3 months      Relevant Medications   phentermine 37.5 MG capsule    Meds ordered this encounter  Medications   phentermine 37.5 MG capsule    Sig: Take 1 capsule (37.5 mg total) by mouth every morning.    Dispense:  30 capsule    Refill:  0    Follow-up: Return in about 3 months (around 03/31/2023) for obesity.    Donell Beers, FNP

## 2023-01-28 ENCOUNTER — Encounter: Payer: Self-pay | Admitting: Podiatry

## 2023-02-04 ENCOUNTER — Other Ambulatory Visit: Payer: Self-pay | Admitting: Nurse Practitioner

## 2023-02-04 DIAGNOSIS — Z6841 Body Mass Index (BMI) 40.0 and over, adult: Secondary | ICD-10-CM

## 2023-02-04 MED ORDER — PHENTERMINE HCL 37.5 MG PO CAPS: 37.5000 mg | ORAL_CAPSULE | ORAL | 0 refills | Status: DC

## 2023-03-08 ENCOUNTER — Other Ambulatory Visit: Payer: Self-pay | Admitting: Nurse Practitioner

## 2023-03-08 DIAGNOSIS — Z6841 Body Mass Index (BMI) 40.0 and over, adult: Secondary | ICD-10-CM

## 2023-04-01 ENCOUNTER — Ambulatory Visit: Payer: Self-pay | Admitting: Nurse Practitioner

## 2023-04-18 ENCOUNTER — Other Ambulatory Visit: Payer: Self-pay | Admitting: Nurse Practitioner

## 2023-04-18 DIAGNOSIS — Z6841 Body Mass Index (BMI) 40.0 and over, adult: Secondary | ICD-10-CM

## 2023-04-19 ENCOUNTER — Other Ambulatory Visit: Payer: Self-pay | Admitting: Nurse Practitioner

## 2023-04-19 DIAGNOSIS — Z6841 Body Mass Index (BMI) 40.0 and over, adult: Secondary | ICD-10-CM

## 2023-04-19 MED ORDER — PHENTERMINE HCL 37.5 MG PO CAPS: 37.5000 mg | ORAL_CAPSULE | Freq: Every day | ORAL | 0 refills | Status: DC

## 2023-05-16 ENCOUNTER — Other Ambulatory Visit: Payer: Self-pay | Admitting: Nurse Practitioner

## 2023-05-16 ENCOUNTER — Ambulatory Visit: Payer: Self-pay

## 2023-05-16 DIAGNOSIS — S39012A Strain of muscle, fascia and tendon of lower back, initial encounter: Secondary | ICD-10-CM

## 2023-05-16 DIAGNOSIS — M549 Dorsalgia, unspecified: Secondary | ICD-10-CM

## 2023-05-28 ENCOUNTER — Ambulatory Visit (INDEPENDENT_AMBULATORY_CARE_PROVIDER_SITE_OTHER): Admitting: Nurse Practitioner

## 2023-05-28 VITALS — BP 125/79 | HR 93 | Temp 97.6°F | Wt 212.0 lb

## 2023-05-28 DIAGNOSIS — G4700 Insomnia, unspecified: Secondary | ICD-10-CM | POA: Diagnosis not present

## 2023-05-28 DIAGNOSIS — E669 Obesity, unspecified: Secondary | ICD-10-CM

## 2023-05-28 DIAGNOSIS — F419 Anxiety disorder, unspecified: Secondary | ICD-10-CM

## 2023-05-28 DIAGNOSIS — T7840XD Allergy, unspecified, subsequent encounter: Secondary | ICD-10-CM

## 2023-05-28 MED ORDER — PHENTERMINE HCL 37.5 MG PO CAPS: 37.5000 mg | ORAL_CAPSULE | Freq: Every day | ORAL | 0 refills | Status: AC

## 2023-05-28 NOTE — Assessment & Plan Note (Addendum)
 Patient complains of insomnia, snoring, apnea, does not feel rested after waking up in the morning.  Gets about 6 hours of sleep nightly.  Has trouble falling asleep and staying asleep.  Epworth sleepiness scale score was 17 today Sleep hygiene discussed Patient referred for sleep study

## 2023-05-28 NOTE — Assessment & Plan Note (Addendum)
    05/28/2023    3:42 PM 11/27/2022    2:51 PM 10/15/2022    1:45 PM  GAD 7 : Generalized Anxiety Score  Nervous, Anxious, on Edge 1 1 1   Control/stop worrying 1 0 1  Worry too much - different things 1 1 1   Trouble relaxing 0 1 2  Restless 0 0 1  Easily annoyed or irritable 0 1 1  Afraid - awful might happen 0 0 0  Total GAD 7 Score 3 4 7   Anxiety Difficulty Not difficult at all Not difficult at all Somewhat difficult  See therapist once weekly, therapy helping .  No more taking Zoloft  25 mg daily Continue therapy

## 2023-05-28 NOTE — Progress Notes (Addendum)
 Established Patient Office Visit  Subjective:  Patient ID: Crystal Cunningham, female    DOB: Dec 23, 1990  Age: 33 y.o. MRN: [id number]  CC:  Chief Complaint  Patient presents with   Weight Loss   Anxiety    HPI Crystal Cunningham is a 33 y.o. female  has a past medical history of Allergies (10/15/2022), Allergy, Annual physical exam (11/27/2022), Anxiety, Arthritis, BMI 40.0-44.9, adult (HCC) (11/27/2022), Chronic pain of both feet (10/15/2022), and GERD (gastroesophageal reflux disease).  Patient presents for follow-up for obesity. Please see assessment and plan section for full HPI      Past Medical History:  Diagnosis Date   Allergies 10/15/2022   Allergy    Fish   Annual physical exam 11/27/2022   Anxiety    Arthritis    BMI 40.0-44.9, adult (HCC) 11/27/2022   Chronic pain of both feet 10/15/2022   GERD (gastroesophageal reflux disease)     History reviewed. No pertinent surgical history.  Family History  Problem Relation Age of Onset   Arthritis Mother    Diabetes Mother    Hypertension Mother    Obesity Mother    Varicose Veins Mother    Hypothyroidism Mother    Asthma Father    Hyperlipidemia Father    High blood pressure Father     Social History   Socioeconomic History   Marital status: Single    Spouse name: Not on file   Number of children: Not on file   Years of education: Not on file   Highest education level: Some college, no degree  Occupational History   Not on file  Tobacco Use   Smoking status: Never   Smokeless tobacco: Not on file  Substance and Sexual Activity   Alcohol use: Yes    Alcohol/week: 3.0 standard drinks of alcohol    Types: 1 Glasses of wine, 2 Cans of beer per week    Comment: occasionally   Drug use: Never   Sexual activity: Not Currently    Birth control/protection: Inserts  Other Topics Concern   Not on file  Social History Narrative   Lives home alone    Social Drivers of Health   Financial Resource Strain: Low  Risk  (05/28/2023)   Overall Financial Resource Strain (CARDIA)    Difficulty of Paying Living Expenses: Not hard at all  Food Insecurity: No Food Insecurity (05/28/2023)   Hunger Vital Sign    Worried About Running Out of Food in the Last Year: Never true    Ran Out of Food in the Last Year: Never true  Transportation Needs: No Transportation Needs (05/28/2023)   PRAPARE - Administrator, Civil Service (Medical): No    Lack of Transportation (Non-Medical): No  Physical Activity: Insufficiently Active (05/28/2023)   Exercise Vital Sign    Days of Exercise per Week: 2 days    Minutes of Exercise per Session: 30 min  Stress: Stress Concern Present (05/28/2023)   [employer] of Occupational Health - Occupational Stress Questionnaire    Feeling of Stress : To some extent  Social Connections: Unknown (05/28/2023)   Social Connection and Isolation Panel [NHANES]    Frequency of Communication with Friends and Family: Three times a week    Frequency of Social Gatherings with Friends and Family: Once a week    Attends Religious Services: Patient declined    Active Member of Clubs or Organizations: No    Attends Banker Meetings: Not on file  Marital Status: Never married  Catering manager Violence: Not on file    Outpatient Medications Prior to Visit  Medication Sig Dispense Refill   albuterol  (VENTOLIN  HFA) 108 (90 Base) MCG/ACT inhaler Inhale 2 puffs into the lungs every 6 (six) hours as needed. 1 each 1   etonogestrel (NEXPLANON) 68 MG IMPL implant      fluticasone  (FLONASE ) 50 MCG/ACT nasal spray Place 2 sprays into both nostrils daily. 11.1 mL 3   ibuprofen  (ADVIL ) 800 MG tablet Take 1 tablet (800 mg total) by mouth every 8 (eight) hours as needed. 30 tablet 1   loratadine  (CLARITIN ) 10 MG tablet Take 1 tablet (10 mg total) by mouth daily. 90 tablet 1   sodium chloride (OCEAN) 0.65 % SOLN nasal spray Place 1 spray into both nostrils as needed for congestion. 15 mL  1   tretinoin (RETIN-A) 0.025 % cream Apply a pea sized amount to the entire face at nighttime.     metFORMIN  (GLUCOPHAGE -XR) 500 MG 24 hr tablet Take 1 tablet (500 mg total) by mouth daily with breakfast. (Patient not taking: Reported on 05/28/2023) 90 tablet 1   sertraline  (ZOLOFT ) 25 MG tablet Take 1 tablet (25 mg total) by mouth daily. (Patient not taking: Reported on 05/28/2023) 90 tablet 1   phentermine  37.5 MG capsule Take 1 capsule (37.5 mg total) by mouth daily. (Patient not taking: Reported on 05/28/2023) 30 capsule 0   No facility-administered medications prior to visit.    Allergies  Allergen Reactions   Fish-Derived Products Swelling    Pt stated that she "swells up and gets hives."   Sulfa Antibiotics Hives and Swelling    Pt stated that she "swells up and gets hives."    Fish Allergy Hives    ROS Review of Systems  Constitutional:  Negative for appetite change, chills, fatigue and fever.  HENT:  Negative for congestion, postnasal drip, rhinorrhea and sneezing.   Respiratory:  Negative for cough, shortness of breath and wheezing.   Cardiovascular:  Negative for chest pain, palpitations and leg swelling.  Gastrointestinal:  Negative for abdominal pain, constipation, nausea and vomiting.  Genitourinary:  Negative for difficulty urinating, dysuria, flank pain and frequency.  Musculoskeletal:  Negative for arthralgias, back pain, joint swelling and myalgias.  Skin:  Negative for color change, pallor, rash and wound.  Neurological:  Negative for dizziness, facial asymmetry, weakness, numbness and headaches.  Psychiatric/Behavioral:  Positive for sleep disturbance. Negative for behavioral problems, confusion, self-injury and suicidal ideas.       Objective:    Physical Exam Vitals and nursing note reviewed.  Constitutional:      General: She is not in acute distress.    Appearance: Normal appearance. She is obese. She is not ill-appearing, toxic-appearing or diaphoretic.   Eyes:     General: No scleral icterus.       Right eye: No discharge.        Left eye: No discharge.     Extraocular Movements: Extraocular movements intact.     Conjunctiva/sclera: Conjunctivae normal.  Cardiovascular:     Rate and Rhythm: Normal rate and regular rhythm.     Pulses: Normal pulses.     Heart sounds: Normal heart sounds. No murmur heard.    No friction rub. No gallop.  Pulmonary:     Effort: Pulmonary effort is normal. No respiratory distress.     Breath sounds: Normal breath sounds. No stridor. No wheezing, rhonchi or rales.  Chest:     Chest  wall: No tenderness.  Abdominal:     General: There is no distension.     Palpations: Abdomen is soft.     Tenderness: There is no abdominal tenderness. There is no right CVA tenderness, left CVA tenderness or guarding.  Musculoskeletal:        General: No swelling, tenderness, deformity or signs of injury.     Right lower leg: No edema.     Left lower leg: No edema.  Skin:    General: Skin is warm and dry.     Capillary Refill: Capillary refill takes less than 2 seconds.     Coloration: Skin is not jaundiced or pale.     Findings: No bruising, erythema or lesion.  Neurological:     Mental Status: She is alert and oriented to person, place, and time.     Motor: No weakness.     Coordination: Coordination normal.     Gait: Gait normal.  Psychiatric:        Mood and Affect: Mood normal.        Behavior: Behavior normal.        Thought Content: Thought content normal.        Judgment: Judgment normal.     BP 125/79   Pulse 93   Temp 97.6 F (36.4 C)   Wt 212 lb (96.2 kg)   LMP 05/02/2023 (Approximate)   SpO2 100%   BMI 38.78 kg/m  Wt Readings from Last 3 Encounters:  05/28/23 212 lb (96.2 kg)  12/31/22 217 lb 12.8 oz (98.8 kg)  11/27/22 221 lb 12.8 oz (100.6 kg)    No results found for: "TSH" Lab Results  Component Value Date   WBC 9.1 10/19/2022   HGB 11.5 10/19/2022   HCT 36.3 10/19/2022   MCV 86  10/19/2022   PLT 212 10/19/2022   Lab Results  Component Value Date   NA 140 10/19/2022   K 4.4 10/19/2022   CO2 21 10/19/2022   GLUCOSE 87 10/19/2022   BUN 12 10/19/2022   CREATININE 0.67 10/19/2022   BILITOT 0.3 10/19/2022   ALKPHOS 61 10/19/2022   AST 13 10/19/2022   ALT 9 10/19/2022   PROT 6.2 10/19/2022   ALBUMIN 3.9 10/19/2022   CALCIUM 8.5 (L) 10/19/2022   EGFR 118 10/19/2022   Lab Results  Component Value Date   CHOL 176 12/10/2022   Lab Results  Component Value Date   HDL 70 12/10/2022   Lab Results  Component Value Date   LDLCALC 94 12/10/2022   Lab Results  Component Value Date   TRIG 65 12/10/2022   Lab Results  Component Value Date   CHOLHDL 2.5 12/10/2022   No results found for: "HGBA1C"    Assessment & Plan:   Problem List Items Addressed This Visit       Other   Anxiety      05/28/2023    3:42 PM 11/27/2022    2:51 PM 10/15/2022    1:45 PM  GAD 7 : Generalized Anxiety Score  Nervous, Anxious, on Edge 1 1 1   Control/stop worrying 1 0 1  Worry too much - different things 1 1 1   Trouble relaxing 0 1 2  Restless 0 0 1  Easily annoyed or irritable 0 1 1  Afraid - awful might happen 0 0 0  Total GAD 7 Score 3 4 7   Anxiety Difficulty Not difficult at all Not difficult at all Somewhat difficult  See therapist once weekly, therapy  helping .  No more taking Zoloft  25 mg daily Continue therapy        Allergies   Continue Claritin  10 mg daily, Flonase  nasal spray 2 sprays into both nostrils daily      Obesity (BMI 35.0-39.9 without comorbidity) - Primary   Wt Readings from Last 3 Encounters:  05/28/23 212 lb (96.2 kg)  12/31/22 217 lb 12.8 oz (98.8 kg)  11/27/22 221 lb 12.8 oz (100.6 kg)   Body mass index is 38.78 kg/m.  She has been eating a high protein diet, goes to the gym for exercises. She is   moving to a new location so has been off her diet recently Takes phentermine  37.5 mg daily ran out of the medication some days  ago Patient has lost 5 pounds since her last visit We will do phentermine  37.5 mg for another 1 month, she will find out from her insurance about coverage for Ozempic.   Patient denies personal or family history of MTC or MEN 2.  They denied personal history of pancreatitis.       Relevant Medications   phentermine  37.5 MG capsule   Insomnia   Patient complains of insomnia, snoring, apnea, does not feel rested after waking up in the morning.  Gets about 6 hours of sleep nightly.  Has trouble falling asleep and staying asleep.  Epworth sleepiness scale score was 17 today Sleep hygiene discussed Patient referred for sleep study      Relevant Orders   Home sleep test    Meds ordered this encounter  Medications   phentermine  37.5 MG capsule    Sig: Take 1 capsule (37.5 mg total) by mouth daily.    Dispense:  30 capsule    Refill:  0    Follow-up: Return in about 6 months (around 11/28/2023) for CPE.    Damein Gaunce R Rahmir Beever, FNP

## 2023-05-28 NOTE — Assessment & Plan Note (Addendum)
 Wt Readings from Last 3 Encounters:  05/28/23 212 lb (96.2 kg)  12/31/22 217 lb 12.8 oz (98.8 kg)  11/27/22 221 lb 12.8 oz (100.6 kg)   Body mass index is 38.78 kg/m.  She has been eating a high protein diet, goes to the gym for exercises. She is   moving to a new location so has been off her diet recently Takes phentermine  37.5 mg daily ran out of the medication some days ago Patient has lost 5 pounds since her last visit We will do phentermine  37.5 mg for another 1 month, she will find out from her insurance about coverage for Ozempic.   Patient denies personal or family history of MTC or MEN 2.  They denied personal history of pancreatitis.

## 2023-05-28 NOTE — Assessment & Plan Note (Signed)
 Continue Claritin  10 mg daily, Flonase  nasal spray 2 sprays into both nostrils daily

## 2023-05-28 NOTE — Patient Instructions (Addendum)
 Please maintain simple sleep hygiene. - Maintain dark and non-noisy environment in the bedroom. - Please use the bedroom for sleep and sexual activity only. - Do not use electronic devices in the bedroom. - Please take dinner at least 2 hours before bedtime. - Please avoid caffeinated products in the evening, including coffee, soft drinks. - Please try to maintain the regular sleep-wake cycle - Go to bed and wake up at the same time.     1. Insomnia, unspecified type (Primary)  - Home sleep test; Future  2. Anxiety   3. BMI 40.0-44.9, adult (HCC)  - phentermine  37.5 MG capsule; Take 1 capsule (37.5 mg total) by mouth daily.  Dispense: 30 capsule; Refill: 0    It is important that you exercise regularly at least 30 minutes 5 times a week as tolerated  Think about what you will eat, plan ahead. Choose " clean, green, fresh or frozen" over canned, processed or packaged foods which are more sugary, salty and fatty. 70 to 75% of food eaten should be vegetables and fruit. Three meals at set times with snacks allowed between meals, but they must be fruit or vegetables. Aim to eat over a 12 hour period , example 7 am to 7 pm, and STOP after  your last meal of the day. Drink water,generally about 64 ounces per day, no other drink is as healthy. Fruit juice is best enjoyed in a healthy way, by EATING the fruit.  Thanks for choosing [facility] we consider it a privelige to serve you.

## 2023-06-10 ENCOUNTER — Other Ambulatory Visit (HOSPITAL_COMMUNITY): Payer: Self-pay

## 2023-06-10 MED ORDER — DOXYCYCLINE MONOHYDRATE 100 MG PO CAPS: 100.0000 mg | ORAL_CAPSULE | Freq: Two times a day (BID) | ORAL | 3 refills | Status: AC

## 2023-06-10 MED ORDER — DICLOFENAC SODIUM 75 MG PO TBEC
75.0000 mg | DELAYED_RELEASE_TABLET | Freq: Two times a day (BID) | ORAL | 2 refills | Status: AC
Start: 2022-12-06 — End: ?

## 2023-06-10 MED ORDER — CLINDAMYCIN PHOSPHATE 1 % EX SOLN: 1.0000 | Freq: Two times a day (BID) | CUTANEOUS | 3 refills | Status: AC

## 2023-07-09 ENCOUNTER — Ambulatory Visit (HOSPITAL_BASED_OUTPATIENT_CLINIC_OR_DEPARTMENT_OTHER): Attending: Nurse Practitioner | Admitting: Internal Medicine

## 2023-07-09 DIAGNOSIS — G4700 Insomnia, unspecified: Secondary | ICD-10-CM | POA: Insufficient documentation

## 2023-07-13 DIAGNOSIS — G4700 Insomnia, unspecified: Secondary | ICD-10-CM | POA: Diagnosis not present

## 2023-07-13 NOTE — Procedures (Signed)
 [facility] [address] [place], [place] [address] Tel: [phone]   Fax: [phone]  Home Sleep Test Interpretation  Patient Name: Crystal Cunningham, Crystal Cunningham Date: 07/09/2023  Date of Birth: Jun 11, 1990 Study Type: HST  Age: 34 year MRN #: [id number]  Sex: Female Interpreting Physician: NEYSA RAMA, [id number]  Height: 5' 2 Referring Physician: Folashade Paseda, FNP  Weight: 212.0 lbs Recording Tech: Holly Neeriemer RPSGT RST  BMI: 39.0 Scoring Tech: Will Poet RRT RPSGT RST  ESS: 17 Neck Size: 15   Indications for Polysomnography The patient is a 33 year-old Female who is 5' 2 and weighs 212.0 lbs. Her BMI equals 39.0.  A home sleep apnea test was performed to evaluate for -.  Medication  No Data.   Polysomnogram Data A home sleep test recorded the standard physiologic parameters including EKG, nasal and oral airflow.  Respiratory parameters of chest and abdominal movements were recorded with Respiratory Inductance Plethysmography belts.  Oxygen saturation was recorded by pulse oximetry.   Study Architecture The total recording time of the polysomnogram was 433.1 minutes.  The total monitoring time was 433.5 minutes.  Time spent in Supine position was 186.5 minutes.   Respiratory Events The study revealed a presence of 1 obstructive, - central, and - mixed apneas resulting in an Apnea index of 0.1 events per hour.  There were 1 hypopneas (>=3% desaturation and/or arousal) resulting in an Apnea\Hypopnea Index (AHI >=3% desaturation and/or arousal) of 0.3 events per hour.  There were - hypopneas (>=4% desaturation) resulting in an Apnea\Hypopnea Index (AHI >=4% desaturation) of 0.1 events per hour.  There were - Respiratory Effort Related Arousals resulting in a RERA index of - events per hour. The Respiratory Disturbance Index is 0.3 events per hour.  The snore index was - events per hour.  Mean oxygen saturation was 99.2%.  The lowest  oxygen saturation during monitoring time was 97.0%.  Time spent <=88% oxygen saturation was - minutes (-).  Cardiac Summary The average pulse rate was 77.1 bpm.  The minimum pulse rate was 59.0 bpm while the maximum pulse rate was 109.0 bpm.  Cardiac rhythm was normal  Comment: Occasional apnea and hypopnea, within normal limits, AHI (3%) 0.3/hr. Snoring with oxygen desaturation to a nadir of 97%, mean 99.2%.  Diagnosis: Normal Study  Recommendations: None   This study was personally reviewed and electronically signed by: RAMA NEYSA, MD Accredited Board Certified in Sleep Medicine Date/Time: 07/13/23  11:26   Study Overview  Recording Time: 457.3 min. Monitoring Time: 433.5 min.  Analysis Start:  11:06:43 PM Supine Time: 186.5 min.  Analysis Stop:  06:19:51 AM     Study Summary   Count Index Longest Event Duration  Apneas & Hypopneas: 2 0.3  Apneas: 10.0 sec.     Hypopneas: 30.8 sec.  RERAs: - - - sec.  Desaturations: 1 0.1 35.3 sec.  Snores: - - - sec.    Minimum Oxygen Saturation: 97.0%    Respiratory Summary   Total Duration Supine Non-Supine   Count Index Average Longest Count Index Count Index  Obstructive Apnea 1 0.1 10.0 10.0 1 0.3 - -   Mixed Apnea - - - - - - - -   Central Apnea - - - - - - - -   Total Apneas 1 0.1 10.0 10.0 1 0.3 - -            Hypopneas 3% 1 0.1 N.A. N.A. - - 1  0.2   Apneas & Hyp. 3% 2 0.3 N.A. N.A. 1 0.3 1 0.2            Hypopneas 4% - - N.A. N.A. - - - -  Apneas & Hyp. 4% 1 0.1 N.A. N.A. 1 0.3 - -             RERAs - - - - - - - -  RDI 2 0.3 N.A. N.A. 1 0.3 1 0.2   Oxygen Saturation Summary   Total Supine Non-Supine  Average SpO2 99.2% 99.2% 99.2%  Minimum SpO2 97.0% 98.0% 97.0%   Maximum SpO2 100.0% 100.0% 100.0%   Oxygen Saturation Distribution  Range (%) Time in range (min) Time in range (%)  90.0 - 100.0 432.2 100.0%  80.0 - 90.0 - -  70.0 - 80.0 - -  60.0 - 70.0 - -  50.0 - 60.0 - -  0.0 - 50.0 - -  Time Spent  <=88% SpO2  Range (%) Time in range (min) Time in range (%)  0.0 - 88.0 - -  Cardiac Summary   Total Supine Non-Supine  Average Pulse Rate (BPM) 77.1 80.7 74.4  Minimum Pulse Rate (BPM) 59.0 60.0 59.0  Maximum Pulse Rate (BPM) 109.0 109.0 103.0                      Technologist Comments  -                        Reggy Neysa Bateman, Biomedical engineer of Sleep Medicine  ELECTRONICALLY SIGNED ON:  07/13/2023, 11:22 AM [facility] PH: [phone]   FX: [phone] ACCREDITED BY THE AMERICAN ACADEMY OF SLEEP MEDICINE

## 2023-07-16 NOTE — Progress Notes (Signed)
 No answer. LVM for call back. KH

## 2023-07-18 NOTE — Progress Notes (Signed)
 Pt was advised [facility]

## 2023-07-23 NOTE — Progress Notes (Signed)
 Pt was advised [facility]

## 2023-08-07 ENCOUNTER — Other Ambulatory Visit: Payer: Self-pay | Admitting: Nurse Practitioner

## 2023-08-07 DIAGNOSIS — E669 Obesity, unspecified: Secondary | ICD-10-CM

## 2023-08-07 MED ORDER — TIRZEPATIDE-WEIGHT MANAGEMENT 2.5 MG/0.5ML ~~LOC~~ SOLN
2.5000 mg | SUBCUTANEOUS | 0 refills | Status: AC
Start: 2023-08-07 — End: ?

## 2023-08-08 ENCOUNTER — Other Ambulatory Visit: Payer: Self-pay

## 2023-08-14 ENCOUNTER — Other Ambulatory Visit: Payer: Self-pay

## 2023-10-23 ENCOUNTER — Telehealth: Admitting: Family Medicine

## 2023-10-23 ENCOUNTER — Encounter: Payer: Self-pay | Admitting: Family Medicine

## 2023-10-23 DIAGNOSIS — L239 Allergic contact dermatitis, unspecified cause: Secondary | ICD-10-CM | POA: Diagnosis not present

## 2023-10-23 MED ORDER — PREDNISONE 10 MG (21) PO TBPK: ORAL_TABLET | ORAL | 0 refills | Status: AC

## 2023-10-23 MED ORDER — TRIAMCINOLONE ACETONIDE 0.025 % EX OINT: 1.0000 | TOPICAL_OINTMENT | Freq: Two times a day (BID) | CUTANEOUS | 0 refills | Status: AC

## 2023-10-23 NOTE — Progress Notes (Signed)
 Virtual Visit Consent   Crystal Cunningham, you are scheduled for a virtual visit with a [facility] provider today. Just as with appointments in the office, your consent must be obtained to participate. Your consent will be active for this visit and any virtual visit you may have with one of our providers in the next 365 days. If you have a MyChart account, a copy of this consent can be sent to you electronically.  As this is a virtual visit, video technology does not allow for your provider to perform a traditional examination. This may limit your provider's ability to fully assess your condition. If your provider identifies any concerns that need to be evaluated in person or the need to arrange testing (such as labs, EKG, etc.), we will make arrangements to do so. Although advances in technology are sophisticated, we cannot ensure that it will always work on either your end or our end. If the connection with a video visit is poor, the visit may have to be switched to a telephone visit. With either a video or telephone visit, we are not always able to ensure that we have a secure connection.  By engaging in this virtual visit, you consent to the provision of healthcare and authorize for your insurance to be billed (if applicable) for the services provided during this visit. Depending on your insurance coverage, you may receive a charge related to this service.  I need to obtain your verbal consent now. Are you willing to proceed with your visit today? Crystal Cunningham has provided verbal consent on 10/23/2023 for a virtual visit (video or telephone). Crystal CHRISTELLA Barefoot, NP  Date: 10/23/2023 9:16 AM   Virtual Visit via Video Note   I, Crystal Cunningham, connected with  Crystal Cunningham  ([id number], 1990/05/03) on 10/23/23 at  9:15 AM EDT by a video-enabled telemedicine application and verified that I am speaking with the correct person using two identifiers.  Location: Patient: Virtual Visit Location Patient:  Home Provider: Virtual Visit Location Provider: Home Office   I discussed the limitations of evaluation and management by telemedicine and the availability of in person appointments. The patient expressed understanding and agreed to proceed.    History of Present Illness: Crystal Cunningham is a 33 y.o. who identifies as a female who was assigned female at birth, and is being seen today for bumps/rash on arm.  Onset was yesterday after getting back from travelling from [place] -(flew on plane- no others with her have similar systems) noticed a spot on hand and then it started spreading down the arm- back and legs now in the last 24 hours.  Associated symptoms are starting to whelp/hive like, itching- a lot Modifying factors are benadryl and hydrocortisone cream not helping  Denies chest pain, shortness of breath, fevers, chills, drainage  Allergies- Tide and Fish (but not exposed to either on trip).   Problems:  Patient Active Problem List   Diagnosis Date Noted   Insomnia 05/28/2023   Obesity (BMI 35.0-39.9 without comorbidity) 11/27/2022   Annual physical exam 11/27/2022   Anxiety 10/15/2022   Chronic pain of both feet 10/15/2022   Allergies 10/15/2022    Allergies:  Allergies  Allergen Reactions   Fish-Derived Products Swelling    Pt stated that she swells up and gets hives.   Sulfa Antibiotics Hives and Swelling    Pt stated that she swells up and gets hives.    Fish Allergy Hives   Medications:  Current Outpatient Medications:  albuterol  (VENTOLIN  HFA) 108 (90 Base) MCG/ACT inhaler, Inhale 2 puffs into the lungs every 6 (six) hours as needed., Disp: 6.7 g, Rfl: 1   clindamycin  (CLEOCIN  T) 1 % external solution, Apply 1 Application topically 2 (two) times daily or as needed, Disp: 60 mL, Rfl: 3   diclofenac  (VOLTAREN ) 75 MG EC tablet, Take 1 tablet (75 mg total) by mouth 2 (two) times daily., Disp: 60 tablet, Rfl: 2   doxycycline  (MONODOX ) 100 MG capsule, Take 1 capsule  (100 mg total) by mouth 2 (two) times daily (every 12 hours) with food and water, sun warning., Disp: 60 capsule, Rfl: 3   etonogestrel (NEXPLANON) 68 MG IMPL implant, , Disp: , Rfl:    fluticasone  (FLONASE ) 50 MCG/ACT nasal spray, Place 2 sprays into both nostrils daily., Disp: 16 g, Rfl: 3   ibuprofen  (ADVIL ) 800 MG tablet, Take 1 tablet (800 mg total) by mouth every 8 (eight) hours as needed., Disp: 30 tablet, Rfl: 1   loratadine  (CLARITIN ) 10 MG tablet, Take 1 tablet (10 mg total) by mouth daily., Disp: 90 tablet, Rfl: 1   metFORMIN  (GLUCOPHAGE -XR) 500 MG 24 hr tablet, Take 1 tablet (500 mg total) by mouth daily with breakfast. (Patient not taking: Reported on 05/28/2023), Disp: 90 tablet, Rfl: 1   phentermine  37.5 MG capsule, Take 1 capsule (37.5 mg total) by mouth daily., Disp: 30 capsule, Rfl: 0   sertraline  (ZOLOFT ) 25 MG tablet, Take 1 tablet (25 mg total) by mouth daily. (Patient not taking: Reported on 05/28/2023), Disp: 90 tablet, Rfl: 1   sodium chloride (OCEAN) 0.65 % SOLN nasal spray, Place 1 spray into both nostrils as needed for congestion., Disp: 15 mL, Rfl: 1   tirzepatide  (ZEPBOUND ) 2.5 MG/0.5ML injection vial, Inject 2.5 mg into the skin once a week., Disp: 2 mL, Rfl: 0   tretinoin (RETIN-A) 0.025 % cream, Apply a pea sized amount to the entire face at nighttime., Disp: , Rfl:   Observations/Objective: Patient is well-developed, well-nourished in no acute distress.  Resting comfortably  at home.  Head is normocephalic, atraumatic.  No labored breathing.  Speech is clear and coherent with logical content.  Patient is alert and oriented at baseline.    Assessment and Plan:  1. Allergic contact dermatitis, unspecified trigger (Primary)  - predniSONE  (STERAPRED UNI-PAK 21 TAB) 10 MG (21) TBPK tablet; Take as directed  Dispense: 21 tablet; Refill: 0 - triamcinolone  (KENALOG ) 0.025 % ointment; Apply 1 Application topically 2 (two) times daily.  Dispense: 30 g; Refill:  0   -possible reaction to someone she sat beside on plane given tide allergy -or has developed a new allergy -treating with prednisone  oral given the wide spread of hives/rash -follow up if not improving -no red flags for airway trouble   Reviewed side effects, risks and benefits of medication.    Patient acknowledged agreement and understanding of the plan.   Past Medical, Surgical, Social History, Allergies, and Medications have been Reviewed.    Follow Up Instructions: I discussed the assessment and treatment plan with the patient. The patient was provided an opportunity to ask questions and all were answered. The patient agreed with the plan and demonstrated an understanding of the instructions.  A copy of instructions were sent to the patient via MyChart unless otherwise noted below.    The patient was advised to call back or seek an in-person evaluation if the symptoms worsen or if the condition fails to improve as anticipated.    Crystal HERO  Moishe, NP

## 2023-10-23 NOTE — Patient Instructions (Addendum)
 Coleen Rocks, thank you for joining [name], NP for today's virtual visit.  While this provider is not your primary care provider (PCP), if your PCP is located in our provider database this encounter information will be shared with them immediately following your visit.   A [facility] MyChart account gives you access to today's visit and all your visits, tests, and labs performed at [facility]  click here if you don't have a [facility] MyChart account or go to [email or web address]  Consent: (Patient) Coleen Rocks provided verbal consent for this virtual visit at the beginning of the encounter.  Current Medications:  Current Outpatient Medications:    predniSONE  (STERAPRED UNI-PAK 21 TAB) 10 MG (21) TBPK tablet, Take as directed, Disp: 21 tablet, Rfl: 0   triamcinolone  (KENALOG ) 0.025 % ointment, Apply 1 Application topically 2 (two) times daily., Disp: 30 g, Rfl: 0   albuterol  (VENTOLIN  HFA) 108 (90 Base) MCG/ACT inhaler, Inhale 2 puffs into the lungs every 6 (six) hours as needed., Disp: 6.7 g, Rfl: 1   clindamycin  (CLEOCIN  T) 1 % external solution, Apply 1 Application topically 2 (two) times daily or as needed, Disp: 60 mL, Rfl: 3   diclofenac  (VOLTAREN ) 75 MG EC tablet, Take 1 tablet (75 mg total) by mouth 2 (two) times daily., Disp: 60 tablet, Rfl: 2   doxycycline  (MONODOX ) 100 MG capsule, Take 1 capsule (100 mg total) by mouth 2 (two) times daily (every 12 hours) with food and water, sun warning., Disp: 60 capsule, Rfl: 3   etonogestrel (NEXPLANON) 68 MG IMPL implant, , Disp: , Rfl:    fluticasone  (FLONASE ) 50 MCG/ACT nasal spray, Place 2 sprays into both nostrils daily., Disp: 16 g, Rfl: 3   ibuprofen  (ADVIL ) 800 MG tablet, Take 1 tablet (800 mg total) by mouth every 8 (eight) hours as needed., Disp: 30 tablet, Rfl: 1   loratadine  (CLARITIN ) 10 MG tablet, Take 1 tablet (10 mg total) by mouth daily., Disp: 90 tablet, Rfl: 1   metFORMIN  (GLUCOPHAGE -XR) 500 MG  24 hr tablet, Take 1 tablet (500 mg total) by mouth daily with breakfast. (Patient not taking: Reported on 05/28/2023), Disp: 90 tablet, Rfl: 1   phentermine  37.5 MG capsule, Take 1 capsule (37.5 mg total) by mouth daily., Disp: 30 capsule, Rfl: 0   sertraline  (ZOLOFT ) 25 MG tablet, Take 1 tablet (25 mg total) by mouth daily. (Patient not taking: Reported on 05/28/2023), Disp: 90 tablet, Rfl: 1   sodium chloride (OCEAN) 0.65 % SOLN nasal spray, Place 1 spray into both nostrils as needed for congestion., Disp: 15 mL, Rfl: 1   tirzepatide  (ZEPBOUND ) 2.5 MG/0.5ML injection vial, Inject 2.5 mg into the skin once a week., Disp: 2 mL, Rfl: 0   tretinoin (RETIN-A) 0.025 % cream, Apply a pea sized amount to the entire face at nighttime., Disp: , Rfl:    Medications ordered in this encounter:  Meds ordered this encounter  Medications   predniSONE  (STERAPRED UNI-PAK 21 TAB) 10 MG (21) TBPK tablet    Sig: Take as directed    Dispense:  21 tablet    Refill:  0    Supervising Provider:   LAMPTEY, PHILIP O [[id number]]   triamcinolone  (KENALOG ) 0.025 % ointment    Sig: Apply 1 Application topically 2 (two) times daily.    Dispense:  30 g    Refill:  0    Supervising Provider:   BLAISE ALEENE KIDD [id number]     *If you need refills on other  medications prior to your next appointment, please contact your pharmacy*  Follow-Up: Call back or seek an in-person evaluation if the symptoms worsen or if the condition fails to improve as anticipated.  [facility] [phone]  Other Instructions   -possible reaction to someone she sat beside on plane given tide allergy -or has developed a new allergy -treating with prednisone  oral given the wide spread of hives/rash -follow up if not improving  Contact Dermatitis Dermatitis is when your skin becomes red, sore, and swollen.  Contact dermatitis happens when your body reacts to something that touches the skin. There are 2 types: Irritant contact  dermatitis. This is when something bothers your skin, like soap. Allergic contact dermatitis. This is when your skin touches something you are allergic to, like poison ivy. What are the causes? Irritant contact dermatitis may be caused by: Makeup. Soaps. Detergents. Bleaches. Acids. Metals, like nickel. Allergic contact dermatitis may be caused by: Plants. Chemicals. Jewelry. Latex. Medicines. Preservatives. These are things added to products to help them last longer. There may be some in your clothes. What increases the risk? Having a job where you have to be near things that bother your skin. Having asthma or eczema. What are the signs or symptoms?  Dry or flaky skin. Redness. Cracks. Itching. Moderate symptoms of this condition include: Pain or a burning feeling. Blisters. Blood or clear fluid coming from cracks in your skin. Swelling. This may be on your eyelids, mouth, or genitals. How is this treated? Your doctor will find out what is making your skin react. Then, you can protect your skin. You may need to use: Steroid creams, ointments, or medicines. Antibiotics or other ointments, if you have a skin infection. Lotion or medicines to help with itching. A bandage. Follow these instructions at home: Skin care Put moisturizer on your skin when it needs it. Put cool, wet cloths on your skin (cool compresses). Put a baking soda paste on your skin. Stir water into baking soda until it looks like a paste. Do not scratch your skin. Try not to have things rub up against your skin. Avoid tight clothing. Avoid using soaps, perfumes, and dyes. Check your skin every day for signs of infection. Check for: More redness, swelling, or pain. More fluid or blood. Warmth. Pus or a bad smell. Medicines Take or apply over-the-counter and prescription medicines only as told by your doctor. If you were prescribed antibiotics, take or apply them as told by your doctor. Do not stop  using them even if you start to feel better. Bathing Take a bath with: Epsom salts. Baking soda. Colloidal oatmeal. Bathe less often. Bathe in warm water. Try not to use hot water. Bandage care If you were given a bandage, change it as told by your doctor. Wash your hands with soap and water for at least 20 seconds before and after you change your bandage. If you cannot use soap and water, use hand sanitizer. General instructions Avoid the things that caused your reaction. If you don't know what caused it, keep a journal. Write down: What you eat. What skin products you use. What you drink. What you wear. Contact a doctor if: You do not get better with treatment. You get worse. You have signs of infection. You have a fever. You have new symptoms. Your bone or joint near the area hurts after the skin has healed. Get help right away if: You see red streaks coming from the area. The area turns  darker. You have trouble breathing. This information is not intended to replace advice given to you by your health care provider. Make sure you discuss any questions you have with your health care provider. Document Revised: 07/14/2021 Document Reviewed: 07/14/2021 Elsevier Patient Education  2024 Elsevier Inc.   If you have been instructed to have an in-person evaluation today at a local Urgent Care facility, please use the link below. It will take you to a list of all of our available [facility], including address, phone number and hours of operation. Please do not delay care.  [facility]  If you or a family member do not have a primary care provider, use the link below to schedule a visit and establish care. When you choose a [facility] primary care physician or advanced practice provider, you gain a long-term partner in health. Find a Primary Care Provider  Learn more about [facility] in-office and virtual care options: [facility] - Get Care Now

## 2023-11-29 ENCOUNTER — Ambulatory Visit: Payer: Self-pay | Admitting: Nurse Practitioner

## 2023-12-25 ENCOUNTER — Ambulatory Visit
Admission: RE | Admit: 2023-12-25 | Discharge: 2023-12-25 | Disposition: A | Discharge status: Home / Self Care | Source: Ambulatory Visit | Attending: Family Medicine | Admitting: Family Medicine

## 2023-12-25 VITALS — BP 138/83 | HR 104 | Temp 98.7°F | Resp 18

## 2023-12-25 DIAGNOSIS — J029 Acute pharyngitis, unspecified: Secondary | ICD-10-CM

## 2023-12-25 DIAGNOSIS — B349 Viral infection, unspecified: Secondary | ICD-10-CM

## 2023-12-25 DIAGNOSIS — R051 Acute cough: Secondary | ICD-10-CM

## 2023-12-25 LAB — POC COVID19/FLU A&B COMBO
Covid Antigen, POC: NEGATIVE
Influenza A Antigen, POC: NEGATIVE
Influenza B Antigen, POC: NEGATIVE

## 2023-12-25 LAB — POCT RAPID STREP A (OFFICE): Rapid Strep A Screen: NEGATIVE

## 2023-12-25 MED ORDER — PROMETHAZINE-DM 6.25-15 MG/5ML PO SYRP: 5.0000 mL | ORAL_SOLUTION | Freq: Four times a day (QID) | ORAL | 0 refills | Status: AC | PRN

## 2023-12-25 NOTE — ED Provider Notes (Signed)
 [facility]    CSN: [id number] Arrival date & time: 12/25/23  1019      History   Chief Complaint Chief Complaint  Patient presents with   Cough    I woke up with sore throat and cough and bad headache - Entered by patient    HPI Crystal Cunningham is a 33 y.o. female  presents for evaluation of URI symptoms for 2 days. Patient reports associated symptoms of cough, congestion, sore throat. Denies N/V/D, fevers, ear pain, body aches, shortness of breath. Patient does not have a hx of asthma. Patient is not an active smoker.   Reports  sick contacts via work.  Pt has taken cold medicine OTC for symptoms. Pt has no other concerns at this time.    Cough Associated symptoms: sore throat     Past Medical History:  Diagnosis Date   Allergies 10/15/2022   Allergy    Fish   Annual physical exam 11/27/2022   Anxiety    Arthritis    BMI 40.0-44.9, adult (HCC) 11/27/2022   Chronic pain of both feet 10/15/2022   GERD (gastroesophageal reflux disease)     Patient Active Problem List   Diagnosis Date Noted   Insomnia 05/28/2023   Obesity (BMI 35.0-39.9 without comorbidity) 11/27/2022   Annual physical exam 11/27/2022   Anxiety 10/15/2022   Chronic pain of both feet 10/15/2022   Allergies 10/15/2022    History reviewed. No pertinent surgical history.  OB History   No obstetric history on file.      Home Medications    Prior to Admission medications   Medication Sig Start Date End Date Taking? Authorizing Provider  promethazine -dextromethorphan (PROMETHAZINE -DM) 6.25-15 MG/5ML syrup Take 5 mLs by mouth 4 (four) times daily as needed for cough. 12/25/23  Yes Jontae Sonier, Jodi R, NP  albuterol  (VENTOLIN  HFA) 108 (90 Base) MCG/ACT inhaler Inhale 2 puffs into the lungs every 6 (six) hours as needed. 10/15/22   Paseda, Folashade R, FNP  clindamycin  (CLEOCIN  T) 1 % external solution Apply 1 Application topically 2 (two) times daily or as needed 06/28/22   Shona Rush, MD   diclofenac  (VOLTAREN ) 75 MG EC tablet Take 1 tablet (75 mg total) by mouth 2 (two) times daily. 12/06/22   McDonald, Juliene SAUNDERS, DPM  doxycycline  (MONODOX ) 100 MG capsule Take 1 capsule (100 mg total) by mouth 2 (two) times daily (every 12 hours) with food and water, sun warning. 06/28/22   Shona Rush, MD  etonogestrel (NEXPLANON) 68 MG IMPL implant  11/27/17   [provider]  fluticasone  (FLONASE ) 50 MCG/ACT nasal spray Place 2 sprays into both nostrils daily. 10/15/22   Paseda, Folashade R, FNP  ibuprofen  (ADVIL ) 800 MG tablet Take 1 tablet (800 mg total) by mouth every 8 (eight) hours as needed. 10/15/22   Paseda, Folashade R, FNP  loratadine  (CLARITIN ) 10 MG tablet Take 1 tablet (10 mg total) by mouth daily. 10/15/22   Paseda, Folashade R, FNP  metFORMIN  (GLUCOPHAGE -XR) 500 MG 24 hr tablet Take 1 tablet (500 mg total) by mouth daily with breakfast. Patient not taking: Reported on 05/28/2023 11/27/22   Paseda, Folashade R, FNP  phentermine  37.5 MG capsule Take 1 capsule (37.5 mg total) by mouth daily. 05/28/23   Paseda, Folashade R, FNP  predniSONE  (STERAPRED UNI-PAK 21 TAB) 10 MG (21) TBPK tablet Take as directed 10/23/23   Moishe Chiquita HERO, NP  sertraline  (ZOLOFT ) 25 MG tablet Take 1 tablet (25 mg total) by mouth daily. Patient not  taking: Reported on 05/28/2023 11/27/22   Paseda, Folashade R, FNP  sodium chloride (OCEAN) 0.65 % SOLN nasal spray Place 1 spray into both nostrils as needed for congestion. 10/15/22   Paseda, Folashade R, FNP  tirzepatide  (ZEPBOUND ) 2.5 MG/0.5ML injection vial Inject 2.5 mg into the skin once a week. 08/07/23   Paseda, Folashade R, FNP  tretinoin (RETIN-A) 0.025 % cream Apply a pea sized amount to the entire face at nighttime. 10/21/15   [provider]  triamcinolone  (KENALOG ) 0.025 % ointment Apply 1 Application topically 2 (two) times daily. 10/23/23   Moishe Chiquita HERO, NP    Family History Family History  Problem Relation Age of Onset   Arthritis Mother     Diabetes Mother    Hypertension Mother    Obesity Mother    Varicose Veins Mother    Hypothyroidism Mother    Asthma Father    Hyperlipidemia Father    High blood pressure Father     Social History Social History   Tobacco Use   Smoking status: Never  Substance Use Topics   Alcohol use: Yes    Alcohol/week: 3.0 standard drinks of alcohol    Types: 1 Glasses of wine, 2 Cans of beer per week    Comment: occasionally   Drug use: Never     Allergies   Fish protein-containing drug products, Sulfa antibiotics, and Fish allergy   Review of Systems Review of Systems  HENT:  Positive for congestion and sore throat.   Respiratory:  Positive for cough.      Physical Exam Triage Vital Signs ED Triage Vitals  Encounter Vitals Group     BP 12/25/23 1025 138/83     Girls Systolic BP Percentile --      Girls Diastolic BP Percentile --      Boys Systolic BP Percentile --      Boys Diastolic BP Percentile --      Pulse Rate 12/25/23 1025 (!) 104     Resp 12/25/23 1025 18     Temp 12/25/23 1025 98.7 F (37.1 C)     Temp src --      SpO2 12/25/23 1025 97 %     Weight --      Height --      Head Circumference --      Peak Flow --      Pain Score 12/25/23 1024 8     Pain Loc --      Pain Education --      Exclude from Growth Chart --    No data found.  Updated Vital Signs BP 138/83   Pulse (!) 104   Temp 98.7 F (37.1 C)   Resp 18   LMP 12/13/2023 (Exact Date)   SpO2 97%   Visual Acuity Right Eye Distance:   Left Eye Distance:   Bilateral Distance:    Right Eye Near:   Left Eye Near:    Bilateral Near:     Physical Exam Vitals and nursing note reviewed.  Constitutional:      General: She is not in acute distress.    Appearance: She is well-developed. She is not ill-appearing.  HENT:     Head: Normocephalic and atraumatic.     Right Ear: Tympanic membrane and ear canal normal.     Left Ear: Tympanic membrane and ear canal normal.     Nose: Congestion  present.     Mouth/Throat:     Mouth: Mucous membranes are  moist.     Pharynx: Oropharynx is clear. Uvula midline. Posterior oropharyngeal erythema present.     Tonsils: No tonsillar exudate or tonsillar abscesses.  Eyes:     Conjunctiva/sclera: Conjunctivae normal.     Pupils: Pupils are equal, round, and reactive to light.  Cardiovascular:     Rate and Rhythm: Normal rate and regular rhythm.     Heart sounds: Normal heart sounds.  Pulmonary:     Effort: Pulmonary effort is normal.     Breath sounds: Normal breath sounds. No wheezing, rhonchi or rales.  Musculoskeletal:     Cervical back: Normal range of motion and neck supple.  Lymphadenopathy:     Cervical: No cervical adenopathy.  Skin:    General: Skin is warm and dry.  Neurological:     General: No focal deficit present.     Mental Status: She is alert and oriented to person, place, and time.  Psychiatric:        Mood and Affect: Mood normal.        Behavior: Behavior normal.      UC Treatments / Results  Labs (all labs ordered are listed, but only abnormal results are displayed) Labs Reviewed  POCT RAPID STREP A (OFFICE)  POC COVID19/FLU A&B COMBO    EKG   Radiology No results found.  Procedures Procedures (including critical care time)  Medications Ordered in UC Medications - No data to display  Initial Impression / Assessment and Plan / UC Course  I have reviewed the triage vital signs and the nursing notes.  Pertinent labs & imaging results that were available during my care of the patient were reviewed by me and considered in my medical decision making (see chart for details).     Reviewed exam and symptoms with patient.  Negative COVID flu and strep throat testing.  Discussed viral illness and symptomatic treatment.  Promethazine  DM as needed for cough, side effect profile reviewed.  Encouraged rest fluids and PCP follow-up if symptoms do not improve.  ER precautions reviewed. Final Clinical  Impressions(s) / UC Diagnoses   Final diagnoses:  Sore throat  Acute cough  Viral illness     Discharge Instructions      You tested negative for COVID flu and strep throat.  Please treat your symptoms with over the counter tylenol  or ibuprofen , humidifier, and rest.  You may take your medicine team as needed for cough.  Please of this medication will make you drowsy.  Do not drink alcohol or drive on this medication.  Viral illnesses can last 7-14 days. Please follow up with your PCP if your symptoms are not improving. Please go to the ER for any worsening symptoms. This includes but is not limited to fever you can not control with tylenol  or ibuprofen , you are not able to stay hydrated, you have shortness of breath or chest pain.  Thank you for choosing [facility] for your healthcare needs. I hope you feel better soon!      ED Prescriptions     Medication Sig Dispense Auth. Provider   promethazine -dextromethorphan (PROMETHAZINE -DM) 6.25-15 MG/5ML syrup Take 5 mLs by mouth 4 (four) times daily as needed for cough. 118 mL Norah Fick, Jodi R, NP      PDMP not reviewed this encounter.   Loreda Myla SAUNDERS, NP 12/25/23 1118

## 2023-12-25 NOTE — ED Triage Notes (Signed)
 Pt present with c/o itchy throat and nasal congestion x two days. Pt states she started feeling worse during the day and developed a dry cough. States she is not able to drink anything because she feels she is choking.

## 2023-12-25 NOTE — Discharge Instructions (Signed)
 You tested negative for COVID flu and strep throat.  Please treat your symptoms with over the counter tylenol  or ibuprofen , humidifier, and rest.  You may take your medicine team as needed for cough.  Please of this medication will make you drowsy.  Do not drink alcohol or drive on this medication.  Viral illnesses can last 7-14 days. Please follow up with your PCP if your symptoms are not improving. Please go to the ER for any worsening symptoms. This includes but is not limited to fever you can not control with tylenol  or ibuprofen , you are not able to stay hydrated, you have shortness of breath or chest pain.  Thank you for choosing  for your healthcare needs. I hope you feel better soon!
